# Patient Record
Sex: Female | Born: 2000 | Race: White | Hispanic: Yes | Marital: Single | Smoking: Never smoker
Health system: Southern US, Community
[De-identification: ages and names within clinical notes are randomized; demographics above are authoritative.]

## PROBLEM LIST (undated history)

## (undated) DIAGNOSIS — Z789 Other specified health status: Secondary | ICD-10-CM

## (undated) DIAGNOSIS — F419 Anxiety disorder, unspecified: Secondary | ICD-10-CM

## (undated) DIAGNOSIS — F319 Bipolar disorder, unspecified: Secondary | ICD-10-CM

## (undated) DIAGNOSIS — F32A Depression, unspecified: Secondary | ICD-10-CM

## (undated) DIAGNOSIS — F603 Borderline personality disorder: Secondary | ICD-10-CM

## (undated) HISTORY — PX: NO PAST SURGERIES: SHX2092

## (undated) HISTORY — PX: WISDOM TOOTH EXTRACTION: SHX21

---
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
## 2019-03-05 ENCOUNTER — Emergency Department (HOSPITAL_BASED_OUTPATIENT_CLINIC_OR_DEPARTMENT_OTHER): Payer: Medicaid Other

## 2019-03-05 ENCOUNTER — Encounter (HOSPITAL_BASED_OUTPATIENT_CLINIC_OR_DEPARTMENT_OTHER): Payer: Self-pay

## 2019-03-05 ENCOUNTER — Emergency Department (HOSPITAL_BASED_OUTPATIENT_CLINIC_OR_DEPARTMENT_OTHER)
Admission: EM | Admit: 2019-03-05 | Discharge: 2019-03-05 | Disposition: A | Discharge status: Home / Self Care | Payer: Medicaid Other | Attending: Emergency Medicine | Admitting: Emergency Medicine

## 2019-03-05 ENCOUNTER — Other Ambulatory Visit: Payer: Self-pay

## 2019-03-05 DIAGNOSIS — R1084 Generalized abdominal pain: Secondary | ICD-10-CM | POA: Diagnosis not present

## 2019-03-05 DIAGNOSIS — R112 Nausea with vomiting, unspecified: Secondary | ICD-10-CM

## 2019-03-05 DIAGNOSIS — R197 Diarrhea, unspecified: Secondary | ICD-10-CM | POA: Diagnosis not present

## 2019-03-05 DIAGNOSIS — Z91018 Allergy to other foods: Secondary | ICD-10-CM | POA: Diagnosis not present

## 2019-03-05 DIAGNOSIS — Z793 Long term (current) use of hormonal contraceptives: Secondary | ICD-10-CM | POA: Diagnosis not present

## 2019-03-05 LAB — CBC WITH DIFFERENTIAL/PLATELET
Abs Immature Granulocytes: 0.01 10*3/uL (ref 0.00–0.07)
Basophils Absolute: 0 10*3/uL (ref 0.0–0.1)
Basophils Relative: 1 %
Eosinophils Absolute: 0 10*3/uL (ref 0.0–0.5)
Eosinophils Relative: 0 %
HCT: 40.5 % (ref 36.0–46.0)
Hemoglobin: 13.3 g/dL (ref 12.0–15.0)
Immature Granulocytes: 0 %
Lymphocytes Relative: 32 %
Lymphs Abs: 1.8 10*3/uL (ref 0.7–4.0)
MCH: 31.7 pg (ref 26.0–34.0)
MCHC: 32.8 g/dL (ref 30.0–36.0)
MCV: 96.7 fL (ref 80.0–100.0)
Monocytes Absolute: 0.3 10*3/uL (ref 0.1–1.0)
Monocytes Relative: 6 %
Neutro Abs: 3.4 10*3/uL (ref 1.7–7.7)
Neutrophils Relative %: 61 %
Platelets: 245 10*3/uL (ref 150–400)
RBC: 4.19 MIL/uL (ref 3.87–5.11)
RDW: 12.4 % (ref 11.5–15.5)
WBC: 5.7 10*3/uL (ref 4.0–10.5)
nRBC: 0 % (ref 0.0–0.2)

## 2019-03-05 LAB — URINALYSIS, MICROSCOPIC (REFLEX)

## 2019-03-05 LAB — URINALYSIS, ROUTINE W REFLEX MICROSCOPIC
Bilirubin Urine: NEGATIVE
Glucose, UA: NEGATIVE mg/dL
Ketones, ur: 40 mg/dL — AB
Leukocytes,Ua: NEGATIVE
Nitrite: NEGATIVE
Protein, ur: NEGATIVE mg/dL
Specific Gravity, Urine: >1.03 — ABNORMAL HIGH (ref 1.005–1.030)
pH: 6 (ref 5.0–8.0)

## 2019-03-05 LAB — COMPREHENSIVE METABOLIC PANEL
ALT: 9 U/L (ref 0–44)
AST: 13 U/L — ABNORMAL LOW (ref 15–41)
Albumin: 4.3 g/dL (ref 3.5–5.0)
Alkaline Phosphatase: 51 U/L (ref 38–126)
Anion gap: 7 (ref 5–15)
BUN: 16 mg/dL (ref 6–20)
CO2: 23 mmol/L (ref 22–32)
Calcium: 9.1 mg/dL (ref 8.9–10.3)
Chloride: 104 mmol/L (ref 98–111)
Creatinine, Ser: 0.61 mg/dL (ref 0.44–1.00)
GFR calc Af Amer: >60 mL/min (ref >60)
GFR calc non Af Amer: >60 mL/min (ref >60)
Glucose, Bld: 88 mg/dL (ref 70–99)
Potassium: 3.8 mmol/L (ref 3.5–5.1)
Sodium: 134 mmol/L — ABNORMAL LOW (ref 135–145)
Total Bilirubin: 0.9 mg/dL (ref 0.3–1.2)
Total Protein: 7.5 g/dL (ref 6.5–8.1)

## 2019-03-05 LAB — LIPASE, BLOOD: Lipase: 23 U/L (ref 11–51)

## 2019-03-05 LAB — PREGNANCY, URINE: Preg Test, Ur: NEGATIVE

## 2019-03-05 MED ORDER — FAMOTIDINE IN NACL 20-0.9 MG/50ML-% IV SOLN
20.0000 mg | Freq: Once | INTRAVENOUS | Status: AC
  Administered 2019-03-05: 20 mg via INTRAVENOUS
  Filled 2019-03-05: qty 50

## 2019-03-05 MED ORDER — ONDANSETRON 4 MG PO TBDP: ORAL_TABLET | ORAL | 0 refills | Status: DC

## 2019-03-05 MED ORDER — OMEPRAZOLE 40 MG PO CPDR: 40.0000 mg | DELAYED_RELEASE_CAPSULE | Freq: Every day | ORAL | 0 refills | Status: DC

## 2019-03-05 MED ORDER — ALUM & MAG HYDROXIDE-SIMETH 200-200-20 MG/5ML PO SUSP
30.0000 mL | Freq: Once | ORAL | Status: AC
  Administered 2019-03-05: 30 mL via ORAL
  Filled 2019-03-05: qty 30

## 2019-03-05 MED ORDER — ACETAMINOPHEN 500 MG PO TABS
1000.0000 mg | ORAL_TABLET | Freq: Once | ORAL | Status: AC
  Administered 2019-03-05: 1000 mg via ORAL
  Filled 2019-03-05: qty 2

## 2019-03-05 MED ORDER — IOHEXOL 300 MG/ML  SOLN
100.0000 mL | Freq: Once | INTRAMUSCULAR | Status: AC | PRN
  Administered 2019-03-05: 100 mL via INTRAVENOUS

## 2019-03-05 MED ORDER — SUCRALFATE 1 GM/10ML PO SUSP: 1.0000 g | Freq: Three times a day (TID) | ORAL | 0 refills | Status: DC

## 2019-03-05 MED ORDER — SODIUM CHLORIDE 0.9 % IV BOLUS
1000.0000 mL | Freq: Once | INTRAVENOUS | Status: AC
  Administered 2019-03-05: 1000 mL via INTRAVENOUS

## 2019-03-05 MED ORDER — LIDOCAINE VISCOUS HCL 2 % MT SOLN
15.0000 mL | Freq: Once | OROMUCOSAL | Status: AC
  Administered 2019-03-05: 15 mL via ORAL
  Filled 2019-03-05: qty 15

## 2019-03-05 MED ORDER — MORPHINE SULFATE (PF) 4 MG/ML IV SOLN
4.0000 mg | Freq: Once | INTRAVENOUS | Status: AC
  Administered 2019-03-05: 4 mg via INTRAVENOUS
  Filled 2019-03-05: qty 1

## 2019-03-05 MED ORDER — ONDANSETRON HCL 4 MG/2ML IJ SOLN
4.0000 mg | Freq: Once | INTRAMUSCULAR | Status: AC
  Administered 2019-03-05: 4 mg via INTRAVENOUS
  Filled 2019-03-05: qty 2

## 2019-03-05 NOTE — ED Notes (Signed)
Patient transported to CT 

## 2019-03-05 NOTE — ED Notes (Signed)
Pt tolerating po fluids. No emesis.

## 2019-03-05 NOTE — Discharge Instructions (Signed)
Take omeprazole once daily in the morning at least 30 minutes before your first meal.  Use Carafate before meals and bedtime to help coat her stomach and relieve pain.  Use Zofran as needed for nausea.  You can also use over-the-counter Maalox as needed for breakthrough symptoms.  You can take Tylenol but please avoid ibuprofen, Aleve or aspirin as it can worsen your symptoms.  Please read the information provided on dietary changes because I think your symptoms are likely caused by irritation of the stomach lining and/or an ulcer.  Please call to schedule follow-up appointment with the GI doctor.  Return to the ED for any new or worsening symptoms.

## 2019-03-05 NOTE — ED Provider Notes (Signed)
[facility] EMERGENCY DEPARTMENT Provider Note   CSN: [id number] Arrival date & time: 03/05/19  1223     History   Chief Complaint Chief Complaint  Patient presents with   Abdominal Pain    HPI Sylvia Gray is a 18 y.o. female.     Sylvia HartStephanie M Sieg is a 18 y.o. female who is otherwise healthy, presents to the ED for evaluation of abdominal pain.  Patient reports she has been experiencing abdominal pain for the past 3 weeks.  She reports is primarily located across the upper abdomen, at first she thought it may have been related to something she ate, and because she frequently experiences diarrhea she thought she may have had IBS but pain has persisted over the past 3 weeks.  She reports it always always present.  She is unsure of anything that makes it better or worse.  She tried 1 dose of what she thought was an ulcer medication from her stepmom without improvement, but has not taken anything else to treat this pain.  She reports some intermittent associated vomiting, no hematemesis, she denies diarrhea, constipation or blood in her stools.  She denies dysuria or urinary frequency.  She went to urgent care today regarding symptoms and was noted to have a small amount of blood in her urine but she has not had any urinary symptoms and is not currently on her menstrual cycle.  She reports she intermittently has some bilateral flank pain when she is having worsening of her abdominal pain.  She denies any vaginal discharge or vaginal bleeding.  She is on birth control.  She denies history of similar pain in the past, no previous abdominal surgeries.  No other aggravating or alleviating factors.     History reviewed. No pertinent past medical history.  There are no active problems to display for this patient.   Past Surgical History:  Procedure Laterality Date   WISDOM TOOTH EXTRACTION       OB History    Gravida  0   Para  0   Term  0   Preterm  0   AB  0   Living  0     SAB  0   TAB  0   Ectopic  0   Multiple  0   Live Births  0            Home Medications    Prior to Admission medications   Medication Sig Start Date End Date Taking? Authorizing Provider  levonorgestrel-ethinyl estradiol (NORDETTE) 0.15-30 MG-MCG tablet Take 1 tablet by mouth daily.   Yes [provider]    Family History No family history on file.  Social History Social History   Tobacco Use   Smoking status: Never Smoker   Smokeless tobacco: Never Used  Substance Use Topics   Alcohol use: Yes    Comment: occ   Drug use: Never     Allergies   Apple, Kiwi extract, and Strawberry extract   Review of Systems Review of Systems  Constitutional: Negative for chills and fever.  HENT: Negative.   Respiratory: Negative for cough and shortness of breath.   Cardiovascular: Negative for chest pain.  Gastrointestinal: Positive for abdominal pain, nausea and vomiting. Negative for blood in stool, constipation and diarrhea.  Genitourinary: Negative for dysuria, frequency, pelvic pain, vaginal bleeding and vaginal discharge.  Musculoskeletal: Negative for arthralgias and myalgias.  Skin: Negative for color change and rash.  Neurological: Negative for dizziness, syncope  and light-headedness.     Physical Exam Updated Vital Signs BP 111/76 (BP Location: Left Arm)    Pulse 74    Temp 98.7 F (37.1 C) (Oral)    Resp 18    Ht 5\' 2"  (1.575 m)    Wt 57.2 kg    LMP 02/19/2019    SpO2 100%    BMI 23.05 kg/m   Physical Exam Vitals signs and nursing note reviewed.  Constitutional:      General: She is not in acute distress.    Appearance: She is well-developed. She is not diaphoretic.  HENT:     Head: Normocephalic and atraumatic.  Eyes:     General:        Right eye: No discharge.        Left eye: No discharge.     Pupils: Pupils are equal, round, and reactive to light.  Neck:     Musculoskeletal: Neck supple.    Cardiovascular:     Rate and Rhythm: Normal rate and regular rhythm.     Heart sounds: Normal heart sounds.  Pulmonary:     Effort: Pulmonary effort is normal. No respiratory distress.     Breath sounds: Normal breath sounds. No wheezing or rales.     Comments: Respirations equal and unlabored, patient able to speak in full sentences, lungs clear to auscultation bilaterally Abdominal:     General: Bowel sounds are normal. There is no distension.     Palpations: Abdomen is soft. There is no mass.     Tenderness: There is generalized abdominal tenderness. There is no guarding.     Comments: Abdomen is soft, nondistended, bowel sounds present throughout, there is generalized tenderness throughout the abdomen but it seems to be worse in the epigastric region as well as the right upper and lower quadrants with slight guarding, no rebound tenderness, patient endorses mild discomfort with palpation over the right flank but does not have true CVA tenderness bilaterally.  Musculoskeletal:        General: No deformity.  Skin:    General: Skin is warm and dry.     Capillary Refill: Capillary refill takes less than 2 seconds.  Neurological:     Mental Status: She is alert.     Coordination: Coordination normal.     Comments: Speech is clear, able to follow commands Moves extremities without ataxia, coordination intact  Psychiatric:        Mood and Affect: Mood normal.        Behavior: Behavior normal.      ED Treatments / Results  Labs (all labs ordered are listed, but only abnormal results are displayed) Labs Reviewed  COMPREHENSIVE METABOLIC PANEL - Abnormal; Notable for the following components:      Result Value   Sodium 134 (*)    AST 13 (*)    All other components within normal limits  URINALYSIS, ROUTINE W REFLEX MICROSCOPIC - Abnormal; Notable for the following components:   Specific Gravity, Urine >1.030 (*)    Hgb urine dipstick MODERATE (*)    Ketones, ur 40 (*)    All  other components within normal limits  URINALYSIS, MICROSCOPIC (REFLEX) - Abnormal; Notable for the following components:   Bacteria, UA MANY (*)    All other components within normal limits  URINE CULTURE  LIPASE, BLOOD  CBC WITH DIFFERENTIAL/PLATELET  PREGNANCY, URINE    EKG None  Radiology Ct Abdomen Pelvis W Contrast  Result Date: 03/05/2019 CLINICAL DATA:  Abdominal  pain and hematuria EXAM: CT ABDOMEN AND PELVIS WITH CONTRAST TECHNIQUE: Multidetector CT imaging of the abdomen and pelvis was performed using the standard protocol following bolus administration of intravenous contrast. CONTRAST:  OMNIPAQUE IOHEXOL 300 MG/ML  SOLN COMPARISON:  None. FINDINGS: Lower chest: Lung bases are clear. Hepatobiliary: No focal liver lesions are evident. There is mild fatty infiltration near the fissure for the ligamentum teres. Gallbladder wall is not appreciably thickened. There is no biliary duct dilatation. Pancreas: No pancreatic mass or inflammatory focus. Spleen: No splenic lesions are evident. Adrenals/Urinary Tract: Adrenals bilaterally appear normal. There is no renal mass or hydronephrosis on either side. There is contrast within each renal collecting system which precludes assessment for potential small calculi. No larger calculi evident to the extent that assessment can be made with intravenous contrast in the collecting systems. No ureteral calculi are evident on either side. Urinary bladder wall thickness is within normal limits. Stomach/Bowel: There is no appreciable bowel wall or mesenteric thickening. No evident bowel obstruction. Terminal ileum appears normal. No free air or portal venous air. Vascular/Lymphatic: No abdominal aortic aneurysm. No vascular lesions evident. No adenopathy evident in the abdomen or pelvis. Reproductive: Uterus is anteverted.  No pelvic mass evident. Other: Appendix appears normal. No abscess or ascites is apparent in the abdomen or pelvis. Musculoskeletal:  There are no blastic or lytic bone lesions. There is no intramuscular or abdominal wall lesion. IMPRESSION: 1. No bowel obstruction. No abscess in the abdomen or pelvis. Appendix appears normal. 2. No evident hydronephrosis. No ureteral calculus on either side. No intrarenal calculi are evident with the proviso that intravenous contrast within the collecting systems could mask small calculi. Urinary bladder wall thickness is within normal limits. Electronically Signed   By: Bretta Bang III M.D.   On: 03/05/2019 15:02    Procedures Procedures (including critical care time)  Medications Ordered in ED Medications  sodium chloride 0.9 % bolus 1,000 mL (0 mLs Intravenous Stopped 03/05/19 1615)  ondansetron (ZOFRAN) injection 4 mg (4 mg Intravenous Given 03/05/19 1458)  morphine 4 MG/ML injection 4 mg (4 mg Intravenous Given 03/05/19 1458)  famotidine (PEPCID) IVPB 20 mg premix (0 mg Intravenous Stopped 03/05/19 1615)  iohexol (OMNIPAQUE) 300 MG/ML solution 100 mL (100 mLs Intravenous Contrast Given 03/05/19 1441)  alum & mag hydroxide-simeth (MAALOX/MYLANTA) 200-200-20 MG/5ML suspension 30 mL (30 mLs Oral Given 03/05/19 1614)    And  lidocaine (XYLOCAINE) 2 % viscous mouth solution 15 mL (15 mLs Oral Given 03/05/19 1614)  acetaminophen (TYLENOL) tablet 1,000 mg (1,000 mg Oral Given 03/05/19 1708)     Initial Impression / Assessment and Plan / ED Course  I have reviewed the triage vital signs and the nursing notes.  Pertinent labs & imaging results that were available during my care of the patient were reviewed by me and considered in my medical decision making (see chart for details).  18 year old female who presents with 3 weeks of abdominal pain she localizes it to the upper area but on exam she has tenderness in the epigastric region as well as the right upper and lower quadrants.  She reports multiple episodes of associated vomiting.  Went to urgent care but was sent to the ED for  further evaluation.  She has not been seen previously or has been taking any medications for this pain.  No associated fevers.  Normal bowel movements.  Denies dysuria or urinary frequency.  Will get abdominal labs and CT abdomen pelvis given that she has  both right upper and lower quadrant tenderness as well as to excess the gallbladder, kidneys and appendix, differential also includes gastritis, PUD and GERD as patient reports pain seems to be a bit worse after eating and she reports some intermittent burning radiation up into the chest.  IV fluids, pain medication, Zofran and IV Pepcid ordered for symptomatic treatment.  Patient's lab work is overall reassuring, no leukocytosis and normal hemoglobin, no significant electrolyte derangements, normal renal liver function and normal lipase.  Negative pregnancy.  Urinalysis shows some blood in the urine, no leukocytes, many bacteria.  Patient denies urinary symptoms, will send for urine culture but discussed with patient do not feel this warrants treatment with antibiotics at this time.  After initial treatment she reports improvement in her symptoms.  Her CT scan is overall reassuring without evidence of appendicitis, cholecystitis, kidney stone or other acute abnormality.  Suspect this is likely gastritis or PUD.  Will give GI cocktail and p.o. challenge.  Patient tolerating p.o. fluids.  Will discharge with PPI, Carafate, Zofran.  Recommend GI follow-up.  Discussed return precautions.  Patient and mom expressed understanding and agreement with plan.  Discharged home in good condition.  Final Clinical Impressions(s) / ED Diagnoses   Final diagnoses:  Generalized abdominal pain  Non-intractable vomiting with nausea, unspecified vomiting type    ED Discharge Orders         Ordered    ondansetron (ZOFRAN ODT) 4 MG disintegrating tablet     03/05/19 1750    omeprazole (PRILOSEC) 40 MG capsule  Daily     03/05/19 1750    sucralfate (CARAFATE) 1 GM/10ML  suspension  3 times daily with meals & bedtime     03/05/19 1750           Dartha Lodge, PA-C 03/05/19 1754    Rolan Bucco, MD 03/06/19 1024

## 2019-03-05 NOTE — ED Triage Notes (Signed)
Pt c/o abd pain, n/v/fd x 3 weeks-sent from UC today with note that read RUQ pain and hematuria-no blood work done/states she was given toradol 60mg  inj and states pain is better-NAD-steady gait

## 2019-03-07 LAB — URINE CULTURE

## 2019-04-03 ENCOUNTER — Other Ambulatory Visit: Payer: Self-pay | Admitting: Gastroenterology

## 2019-04-03 DIAGNOSIS — R1084 Generalized abdominal pain: Secondary | ICD-10-CM

## 2019-04-12 ENCOUNTER — Emergency Department (HOSPITAL_BASED_OUTPATIENT_CLINIC_OR_DEPARTMENT_OTHER)
Admission: EM | Admit: 2019-04-12 | Discharge: 2019-04-12 | Disposition: A | Discharge status: Home / Self Care | Payer: Medicaid Other | Attending: Emergency Medicine | Admitting: Emergency Medicine

## 2019-04-12 ENCOUNTER — Other Ambulatory Visit: Payer: Self-pay

## 2019-04-12 DIAGNOSIS — R1084 Generalized abdominal pain: Secondary | ICD-10-CM | POA: Insufficient documentation

## 2019-04-12 DIAGNOSIS — R112 Nausea with vomiting, unspecified: Secondary | ICD-10-CM | POA: Diagnosis not present

## 2019-04-12 DIAGNOSIS — R42 Dizziness and giddiness: Secondary | ICD-10-CM | POA: Diagnosis not present

## 2019-04-12 LAB — CBC WITH DIFFERENTIAL/PLATELET
Abs Immature Granulocytes: 0.01 10*3/uL (ref 0.00–0.07)
Basophils Absolute: 0 10*3/uL (ref 0.0–0.1)
Basophils Relative: 1 %
Eosinophils Absolute: 0 10*3/uL (ref 0.0–0.5)
Eosinophils Relative: 1 %
HCT: 39 % (ref 36.0–46.0)
Hemoglobin: 13.2 g/dL (ref 12.0–15.0)
Immature Granulocytes: 0 %
Lymphocytes Relative: 40 %
Lymphs Abs: 2.3 10*3/uL (ref 0.7–4.0)
MCH: 32.2 pg (ref 26.0–34.0)
MCHC: 33.8 g/dL (ref 30.0–36.0)
MCV: 95.1 fL (ref 80.0–100.0)
Monocytes Absolute: 0.5 10*3/uL (ref 0.1–1.0)
Monocytes Relative: 9 %
Neutro Abs: 2.8 10*3/uL (ref 1.7–7.7)
Neutrophils Relative %: 49 %
Platelets: 252 10*3/uL (ref 150–400)
RBC: 4.1 MIL/uL (ref 3.87–5.11)
RDW: 12.4 % (ref 11.5–15.5)
WBC: 5.6 10*3/uL (ref 4.0–10.5)
nRBC: 0 % (ref 0.0–0.2)

## 2019-04-12 LAB — URINALYSIS, ROUTINE W REFLEX MICROSCOPIC
Glucose, UA: NEGATIVE mg/dL
Ketones, ur: 40 mg/dL — AB
Leukocytes,Ua: NEGATIVE
Nitrite: NEGATIVE
Protein, ur: NEGATIVE mg/dL
Specific Gravity, Urine: 1.025 (ref 1.005–1.030)
pH: 6.5 (ref 5.0–8.0)

## 2019-04-12 LAB — COMPREHENSIVE METABOLIC PANEL
ALT: 14 U/L (ref 0–44)
AST: 18 U/L (ref 15–41)
Albumin: 4.2 g/dL (ref 3.5–5.0)
Alkaline Phosphatase: 52 U/L (ref 38–126)
Anion gap: 10 (ref 5–15)
BUN: 13 mg/dL (ref 6–20)
CO2: 22 mmol/L (ref 22–32)
Calcium: 9.1 mg/dL (ref 8.9–10.3)
Chloride: 102 mmol/L (ref 98–111)
Creatinine, Ser: 0.7 mg/dL (ref 0.44–1.00)
GFR calc Af Amer: >60 mL/min (ref >60)
GFR calc non Af Amer: >60 mL/min (ref >60)
Glucose, Bld: 118 mg/dL — ABNORMAL HIGH (ref 70–99)
Potassium: 3.4 mmol/L — ABNORMAL LOW (ref 3.5–5.1)
Sodium: 134 mmol/L — ABNORMAL LOW (ref 135–145)
Total Bilirubin: 0.8 mg/dL (ref 0.3–1.2)
Total Protein: 7.3 g/dL (ref 6.5–8.1)

## 2019-04-12 LAB — LIPASE, BLOOD: Lipase: 26 U/L (ref 11–51)

## 2019-04-12 LAB — URINALYSIS, MICROSCOPIC (REFLEX)

## 2019-04-12 LAB — PREGNANCY, URINE: Preg Test, Ur: NEGATIVE

## 2019-04-12 MED ORDER — ONDANSETRON 4 MG PO TBDP: 4.0000 mg | ORAL_TABLET | Freq: Four times a day (QID) | ORAL | 1 refills | Status: DC | PRN

## 2019-04-12 MED ORDER — DICYCLOMINE HCL 20 MG PO TABS: 20.0000 mg | ORAL_TABLET | Freq: Three times a day (TID) | ORAL | 0 refills | Status: DC | PRN

## 2019-04-12 MED ORDER — SODIUM CHLORIDE 0.9 % IV BOLUS (SEPSIS)
1000.0000 mL | Freq: Once | INTRAVENOUS | Status: AC
  Administered 2019-04-12: 1000 mL via INTRAVENOUS

## 2019-04-12 MED ORDER — DICYCLOMINE HCL 10 MG PO CAPS
20.0000 mg | ORAL_CAPSULE | Freq: Once | ORAL | Status: AC
  Administered 2019-04-12: 20 mg via ORAL
  Filled 2019-04-12: qty 2

## 2019-04-12 MED ORDER — PANTOPRAZOLE SODIUM 40 MG IV SOLR
40.0000 mg | Freq: Once | INTRAVENOUS | Status: AC
  Administered 2019-04-12: 40 mg via INTRAVENOUS
  Filled 2019-04-12: qty 40

## 2019-04-12 MED ORDER — PANTOPRAZOLE SODIUM 40 MG PO TBEC: 40.0000 mg | DELAYED_RELEASE_TABLET | Freq: Every day | ORAL | 1 refills | Status: DC

## 2019-04-12 MED ORDER — ONDANSETRON HCL 4 MG/2ML IJ SOLN
4.0000 mg | Freq: Once | INTRAMUSCULAR | Status: AC
  Administered 2019-04-12: 4 mg via INTRAVENOUS
  Filled 2019-04-12: qty 2

## 2019-04-12 NOTE — ED Triage Notes (Addendum)
Pt reports generalized abd pain ongoing since last visit to our ED (11/19) and reports pain has not gotten any better. Supposed to see GI in the new year. Tearful in triage. Reports dizzy with position changes. Reports nausea and vomiting. States omeprazole and Carafate that she was given during last visit are not helping much.

## 2019-04-12 NOTE — ED Notes (Signed)
Ice water provided for po challenge.

## 2019-04-12 NOTE — Discharge Instructions (Addendum)
Please keep your appointment with your gastroenterologist as scheduled on January 11.  You may take over-the-counter Tylenol 1000 mg every 6 hours as needed for pain.

## 2019-04-12 NOTE — ED Provider Notes (Signed)
TIME SEEN: 1:12 AM  CHIEF COMPLAINT: Abdominal pain, nausea and vomiting  HPI: Patient is a 18 year old female with no significant past medical history who presents to the emergency department with emesis a month and a half of diffuse abdominal pain, nausea and vomiting.  Symptoms are worse with smelling food and after eating.  Was seen in the emergency department on 03/05/2019 and had no acute abnormality visualized on CT imaging.  Labs unremarkable.  Was discharged home with omeprazole, Carafate and Zofran.  States she ran out of Zofran and states she has really needed it.  She has had worsening nausea and vomiting this past week.  Had diarrhea initially but this has resolved.  No bloody stools or melena.  No dysuria, hematuria, vaginal bleeding or discharge.  States she feels a lot of pressure throughout her abdomen.  States the above medications did not help her pain but did help her nausea and vomiting.  She has an appointment to see gastroenterology on January 11.  States she is feeling weak and lightheaded.  ROS: See HPI Constitutional: no fever  Eyes: no drainage  ENT: no runny nose   Cardiovascular:  no chest pain  Resp: no SOB  GI:  vomiting GU: no dysuria Integumentary: no rash  Allergy: no hives  Musculoskeletal: no leg swelling  Neurological: no slurred speech ROS otherwise negative  PAST MEDICAL HISTORY/PAST SURGICAL HISTORY:  No past medical history on file.  MEDICATIONS:  Prior to Admission medications   Medication Sig Start Date End Date Taking? Authorizing Provider  levonorgestrel-ethinyl estradiol (NORDETTE) 0.15-30 MG-MCG tablet Take 1 tablet by mouth daily.    [provider]  omeprazole (PRILOSEC) 40 MG capsule Take 1 capsule (40 mg total) by mouth daily. 03/05/19   Jacqlyn Larsen, PA-C  ondansetron (ZOFRAN ODT) 4 MG disintegrating tablet 4mg  ODT q4 hours prn nausea/vomit 03/05/19   Jacqlyn Larsen, PA-C  sucralfate (CARAFATE) 1 GM/10ML suspension Take 10  mLs (1 g total) by mouth 4 (four) times daily -  with meals and at bedtime. 03/05/19   Jacqlyn Larsen, PA-C    ALLERGIES:  Allergies  Allergen Reactions  . Apple Swelling  . Kiwi Extract Swelling  . Strawberry Extract Swelling    SOCIAL HISTORY:  Social History   Tobacco Use  . Smoking status: Never Smoker  . Smokeless tobacco: Never Used  Substance Use Topics  . Alcohol use: Yes    Comment: occ    FAMILY HISTORY: No family history on file.  EXAM: BP 123/78 (BP Location: Right Arm)   Pulse 91   Temp 99 F (37.2 C) (Oral)   Resp 20   Ht 5\' 2"  (1.575 m)   Wt 57.2 kg   LMP 03/15/2019 (Approximate)   SpO2 100%   BMI 23.05 kg/m  CONSTITUTIONAL: Alert and oriented and responds appropriately to questions. Well-appearing; well-nourished, intermittently tearful HEAD: Normocephalic EYES: Conjunctivae clear, pupils appear equal, EOM appear intact ENT: normal nose; moist mucous membranes NECK: Supple, normal ROM CARD: RRR; S1 and S2 appreciated; no murmurs, no clicks, no rubs, no gallops RESP: Normal chest excursion without splinting or tachypnea; breath sounds clear and equal bilaterally; no wheezes, no rhonchi, no rales, no hypoxia or respiratory distress, speaking full sentences ABD/GI: Normal bowel sounds; non-distended; soft, mildly tender throughout the abdomen worse in the upper abdomen, no rebound, no guarding, no peritoneal signs, no hepatosplenomegaly BACK:  The back appears normal EXT: Normal ROM in all joints; no deformity noted, no edema; no  cyanosis SKIN: Normal color for age and race; warm; no rash on exposed skin NEURO: Moves all extremities equally PSYCH: The patient's mood and manner are appropriate.   MEDICAL DECISION MAKING: Patient here with diffuse abdominal pain.  Has an appointment to see GI on January 11.  Reports continued nausea and vomiting.  CT scan performed 03/05/2019 for the same symptoms showed no acute abnormality.  I do not feel she needs  repeat CT imaging at this time.  Will repeat labs, urine.  Will treat symptoms with IV fluids, Zofran, Protonix, Bentyl.  Agree with previous provider that symptoms likely related to gastritis, peptic ulcer disease.  She would likely benefit from an endoscopy as an outpatient.  Doubt appendicitis, bowel obstruction, perforation, cholecystitis, pancreatitis, diverticulitis based on her benign exam.  ED PROGRESS: Patient reports feeling better.  Able to tolerate p.o.  Labs, urine here are unremarkable.  Does have small amount of ketones in her urine but has received IV fluids.  Will discharge with prescriptions of Protonix, Zofran, Bentyl.  She has an appoint with gastroenterology on the 11th.  Have encouraged her to keep this appointment.  Recommended bland diet at this time.  Discussed return precautions.   At this time, I do not feel there is any life-threatening condition present. I have reviewed, interpreted and discussed all results (EKG, imaging, lab, urine as appropriate) and exam findings with patient/family. I have reviewed nursing notes and appropriate previous records.  I feel the patient is safe to be discharged home without further emergent workup and can continue workup as an outpatient as needed. Discussed usual and customary return precautions. Patient/family verbalize understanding and are comfortable with this plan.  Outpatient follow-up has been provided as needed. All questions have been answered.      KAIZLEE CARLINO was evaluated in Emergency Department on 04/12/2019 for the symptoms described in the history of present illness. She was evaluated in the context of the global COVID-19 pandemic, which necessitated consideration that the patient might be at risk for infection with the SARS-CoV-2 virus that causes COVID-19. Institutional protocols and algorithms that pertain to the evaluation of patients at risk for COVID-19 are in a state of rapid change based on information  released by regulatory bodies including the CDC and federal and state organizations. These policies and algorithms were followed during the patient's care in the ED.  Patient was seen wearing N95, face shield, gloves.    Kaysa Roulhac, Layla Maw, DO 04/12/19 [phone]

## 2019-04-27 ENCOUNTER — Ambulatory Visit
Admission: RE | Admit: 2019-04-27 | Discharge: 2019-04-27 | Disposition: A | Discharge status: Home / Self Care | Payer: Medicaid Other | Source: Ambulatory Visit | Attending: Gastroenterology | Admitting: Gastroenterology

## 2019-04-27 DIAGNOSIS — R1084 Generalized abdominal pain: Secondary | ICD-10-CM

## 2019-05-01 ENCOUNTER — Other Ambulatory Visit: Payer: Medicaid Other

## 2019-05-05 ENCOUNTER — Other Ambulatory Visit: Payer: Medicaid Other

## 2019-05-08 ENCOUNTER — Ambulatory Visit
Admission: RE | Admit: 2019-05-08 | Discharge: 2019-05-08 | Disposition: A | Discharge status: Home / Self Care | Payer: Medicaid Other | Source: Ambulatory Visit | Attending: Gastroenterology | Admitting: Gastroenterology

## 2019-05-08 DIAGNOSIS — R1084 Generalized abdominal pain: Secondary | ICD-10-CM

## 2019-06-23 ENCOUNTER — Other Ambulatory Visit: Payer: Self-pay

## 2019-06-23 ENCOUNTER — Ambulatory Visit
Admission: EM | Admit: 2019-06-23 | Discharge: 2019-06-23 | Disposition: A | Discharge status: Home / Self Care | Payer: Medicaid Other | Attending: Emergency Medicine | Admitting: Emergency Medicine

## 2019-06-23 DIAGNOSIS — R1084 Generalized abdominal pain: Secondary | ICD-10-CM | POA: Diagnosis not present

## 2019-06-23 MED ORDER — ONDANSETRON HCL 4 MG PO TABS: 4.0000 mg | ORAL_TABLET | Freq: Four times a day (QID) | ORAL | 0 refills | Status: DC

## 2019-06-23 NOTE — ED Provider Notes (Addendum)
[facility]    CSN: [id number] Arrival date & time: 06/23/19  1534      History   Chief Complaint Chief Complaint  Patient presents with  . Abdominal Pain    HPI Sylvia Gray is a 19 y.o. female with chronic generalized abdominal pain presenting for the same.  Feels that it has been worse this week since Saturday: No change in diet, lifestyle, medications.  Patient has had multiple ER evaluations in outpatient GI work-up all negative for acute processes.  Patient endorsing emesis yesterday without bile or blood: No nausea or vomiting today.  Patient denies history of abdominal surgery.  Last bowel movement was this morning without blood, melena, mucus, steatorrhea.  Patient denies difficulty swallowing, choking, drooling, dental pain.  No fever, chills, arthralgias, myalgias.  Patient denies history of GERD.  States previous medications prescribed by ER and GI including Prilosec and Carafate have not been helpful.  States Zofran does help with some nausea, though has not taken this in a while.  LMP 06/16/2019: Not currently sexually active.  Denies pelvic pain, vaginal discharge. Patient notes possible syncopal event last Wednesday while at work.  Feels this is due to prolonged standing "I never get to take a break ".  States Sylvia Gray was at able to drink much water that day either.  Denied history of seizures.  Denies fall, head trauma, LOC, amnesia or irritability prior to or since event.  No recurrence of event.  States her boss was "standing right there and caught me".  Denies history of cardiopulmonary disease.  Denies preceding chest pain, palpitations, difficulty breathing or lightheadedness.  Has not had these symptoms since, including in office today.    History reviewed. No pertinent past medical history.  There are no problems to display for this patient.   Past Surgical History:  Procedure Laterality Date  . WISDOM TOOTH EXTRACTION      OB History    Gravida  0   Para  0   Term  0   Preterm  0   AB  0   Living  0     SAB  0   TAB  0   Ectopic  0   Multiple  0   Live Births  0            Home Medications    Prior to Admission medications   Medication Sig Start Date End Date Taking? Authorizing Provider  levonorgestrel-ethinyl estradiol (NORDETTE) 0.15-30 MG-MCG tablet Take 1 tablet by mouth daily.    [provider]  ondansetron (ZOFRAN) 4 MG tablet Take 1 tablet (4 mg total) by mouth every 6 (six) hours. 06/23/19   Hall-Potvin, [place], PA-C  omeprazole (PRILOSEC) 40 MG capsule Take 1 capsule (40 mg total) by mouth daily. 03/05/19 04/12/19  Jacqlyn Larsen, PA-C  sucralfate (CARAFATE) 1 GM/10ML suspension Take 10 mLs (1 g total) by mouth 4 (four) times daily -  with meals and at bedtime. 03/05/19 04/12/19  Jacqlyn Larsen, PA-C    Family History History reviewed. No pertinent family history.  Social History Social History   Tobacco Use  . Smoking status: Never Smoker  . Smokeless tobacco: Never Used  Substance Use Topics  . Alcohol use: Yes    Comment: occ  . Drug use: Never     Allergies   Apple, Kiwi extract, and Strawberry extract   Review of Systems As per HPI   Physical Exam Triage Vital Signs ED Triage  Vitals  Enc Vitals Group     BP      Pulse      Resp      Temp      Temp src      SpO2      Weight      Height      Head Circumference      Peak Flow      Pain Score      Pain Loc      Pain Edu?      Excl. in GC?    No data found.  Updated Vital Signs BP 121/80 (BP Location: Left Arm)   Pulse 73   Temp 98.8 F (37.1 C) (Oral)   Resp 16   LMP 06/16/2019   SpO2 98%   Visual Acuity Right Eye Distance:   Left Eye Distance:   Bilateral Distance:    Right Eye Near:   Left Eye Near:    Bilateral Near:     Physical Exam Constitutional:      General: Sylvia Gray is not in acute distress.    Appearance: Sylvia Gray is well-developed and normal weight. Sylvia Gray is not  ill-appearing or diaphoretic.  HENT:     Head: Normocephalic and atraumatic.     Mouth/Throat:     Mouth: Mucous membranes are moist.     Pharynx: Oropharynx is clear.  Eyes:     General: No scleral icterus.    Pupils: Pupils are equal, round, and reactive to light.  Cardiovascular:     Rate and Rhythm: Normal rate and regular rhythm.     Heart sounds: No murmur. No gallop.   Pulmonary:     Effort: Pulmonary effort is normal. No respiratory distress.     Breath sounds: No wheezing.  Abdominal:     General: Abdomen is flat. Bowel sounds are normal. There is no distension or abdominal bruit.     Palpations: Abdomen is soft. There is no hepatomegaly or splenomegaly.     Tenderness: There is no abdominal tenderness. There is no right CVA tenderness, left CVA tenderness or guarding. Negative signs include Murphy's sign, Rovsing's sign and McBurney's sign.     Hernia: No hernia is present.  Skin:    General: Skin is warm.     Capillary Refill: Capillary refill takes less than 2 seconds.     Coloration: Skin is not cyanotic, jaundiced, mottled or pale.     Findings: No erythema or rash.  Neurological:     Mental Status: Sylvia Gray is alert and oriented to person, place, and time.      UC Treatments / Results  Labs (all labs ordered are listed, but only abnormal results are displayed) Labs Reviewed - No data to display  EKG   Radiology No results found.  Procedures Procedures (including critical care time)  Medications Ordered in UC Medications - No data to display  Initial Impression / Assessment and Plan / UC Course  I have reviewed the triage vital signs and the nursing notes.  Pertinent labs & imaging results that were available during my care of the patient were reviewed by me and considered in my medical decision making (see chart for details).     Patient afebrile, nontoxic in office today.  Exam is benign.  Previous work-up has been benign as well: Those ER records  reviewed by me at time of visit.  Patient endorsing syncopal event, though this was nearly 1 week ago and without sequela.  No prodrome  or cardiopulmonary symptoms.  Patient denies orthostasis.  Will provide Zofran for nausea and encouraged oral hydration.  This provider coordinated care: Now has PCP appointment on 4/15-intends to keep.  Patient to keep symptom and food logs in the interim.  Recommended patient follow-up with GI provider as well.  Patient requesting work note to be able to have water intake breaks through the day: Provided.  Return precautions discussed, patient verbalized understanding and is agreeable to plan. Final Clinical Impressions(s) / UC Diagnoses   Final diagnoses:  Generalized abdominal pain     Discharge Instructions     Important to keep PCP appointment on 4/15. Keep symptom log in the interim. Also recommend keeping food log. Take Zofran as needed for nausea: Important to been drinking plenty of water to hydrate. Return for worsening abdominal pain, nausea, vomiting, fever, chest pain, difficulty breathing.    ED Prescriptions    Medication Sig Dispense Auth. Provider   ondansetron (ZOFRAN) 4 MG tablet Take 1 tablet (4 mg total) by mouth every 6 (six) hours. 12 tablet Hall-Potvin, [place], PA-C     PDMP not reviewed this encounter.   Hall-Potvin, [place], PA-C 06/23/19 1907    Odette Fraction [place], [place] 06/23/19 2641

## 2019-06-23 NOTE — Discharge Instructions (Signed)
Important to keep PCP appointment on 4/15. Keep symptom log in the interim. Also recommend keeping food log. Take Zofran as needed for nausea: Important to been drinking plenty of water to hydrate. Return for worsening abdominal pain, nausea, vomiting, fever, chest pain, difficulty breathing.

## 2019-06-23 NOTE — ED Triage Notes (Signed)
Pt c/o generalized abdominal pain since November off and on. States also since November having weakness, nausea, decreased appetite and can't sleep at night. States has had GI a neg. work up. Pt states passed out at work last Wednesday for .

## 2019-06-24 ENCOUNTER — Telehealth: Payer: Self-pay | Admitting: Emergency Medicine

## 2019-06-24 NOTE — Telephone Encounter (Signed)
Left voicemail checking in on patient, and encouraged return call with any continuing questions or concerns.    

## 2019-07-30 ENCOUNTER — Encounter: Payer: Self-pay | Admitting: Internal Medicine

## 2019-07-30 ENCOUNTER — Telehealth (INDEPENDENT_AMBULATORY_CARE_PROVIDER_SITE_OTHER): Payer: Medicaid Other | Admitting: Internal Medicine

## 2019-07-30 DIAGNOSIS — Z131 Encounter for screening for diabetes mellitus: Secondary | ICD-10-CM

## 2019-07-30 DIAGNOSIS — R5383 Other fatigue: Secondary | ICD-10-CM | POA: Diagnosis not present

## 2019-07-30 DIAGNOSIS — R109 Unspecified abdominal pain: Secondary | ICD-10-CM

## 2019-07-30 DIAGNOSIS — Z7689 Persons encountering health services in other specified circumstances: Secondary | ICD-10-CM

## 2019-07-30 DIAGNOSIS — G8929 Other chronic pain: Secondary | ICD-10-CM

## 2019-07-30 NOTE — Progress Notes (Signed)
Virtual Visit via Telephone Note  I connected with Sylvia Gray, on 07/30/2019 at 2:04 PM by telephone due to the COVID-19 pandemic and verified that I am speaking with the correct person using two identifiers.   Consent: I discussed the limitations, risks, security and privacy concerns of performing an evaluation and management service by telephone and the availability of in person appointments. I also discussed with the patient that there may be a patient responsible charge related to this service. The patient expressed understanding and agreed to proceed.   Location of Patient: Home   Location of Provider: Clinic    Persons participating in Telemedicine visit: Jaeliana Lococo [facility] Dr. Earlene Plater      History of Present Illness: Patient has a visit to establish care and to f/u on chronic generalized abdominal pain. Patient has had multiple ER/UC visits for the same complaint with normal work up. Has also seen GI specialists for outpatient work up. Seems that she has had a negative work up and no etiology of her abdominal pain has been identified. Patient reports that her abdominal pain has mostly resolved. However, she feels tired/lightheaded and feels like her appetite has diminished.    No past medical history on file. Allergies  Allergen Reactions  . Apple Swelling  . Kiwi Extract Swelling  . Strawberry Extract Swelling    Current Outpatient Medications on File Prior to Visit  Medication Sig Dispense Refill  . levonorgestrel-ethinyl estradiol (NORDETTE) 0.15-30 MG-MCG tablet Take 1 tablet by mouth daily.    . ondansetron (ZOFRAN) 4 MG tablet Take 1 tablet (4 mg total) by mouth every 6 (six) hours. 12 tablet 0  . [DISCONTINUED] omeprazole (PRILOSEC) 40 MG capsule Take 1 capsule (40 mg total) by mouth daily. 30 capsule 0  . [DISCONTINUED] sucralfate (CARAFATE) 1 GM/10ML suspension Take 10 mLs (1 g total) by mouth 4 (four) times daily -   with meals and at bedtime. 420 mL 0   No current facility-administered medications on file prior to visit.    Observations/Objective: NAD. Speaking clearly.  Work of breathing normal.  Alert and oriented. Mood appropriate.   Assessment and Plan: 1. Encounter to establish care  2. Chronic abdominal pain Has had recent normal CT abdomen, barium enema, and upper GI series. Followed by GI as patient.   3. Fatigue, unspecified type Patient now complaining of fatigue and other vague symptoms. Will evaluate for anemia, electrolyte abnormalities, metabolic abnormalities, thyroid disorder, and vitamin deficiencies.  - CBC; Future - Comprehensive metabolic panel; Future - TSH; Future - VITAMIN D 25 Hydroxy (Vit-D Deficiency, Fractures); Future - Vitamin B12; Future  4. Screening for diabetes mellitus (DM) - Hemoglobin A1c; Future   Follow Up Instructions: Lab visit 4/16    I discussed the assessment and treatment plan with the patient. The patient was provided an opportunity to ask questions and all were answered. The patient agreed with the plan and demonstrated an understanding of the instructions.   The patient was advised to call back or seek an in-person evaluation if the symptoms worsen or if the condition fails to improve as anticipated.     I provided 12 minutes total of non-face-to-face time during this encounter including median intraservice time, reviewing previous notes, investigations, ordering medications, medical decision making, coordinating care and patient verbalized understanding at the end of the visit.    Marcy Siren, D.O. Primary [facility] at [facility]  07/30/2019, 2:04 PM

## 2019-07-31 ENCOUNTER — Other Ambulatory Visit: Payer: Medicaid Other

## 2020-03-16 ENCOUNTER — Emergency Department (HOSPITAL_BASED_OUTPATIENT_CLINIC_OR_DEPARTMENT_OTHER)
Admission: EM | Admit: 2020-03-16 | Discharge: 2020-03-16 | Disposition: A | Discharge status: Home / Self Care | Payer: Medicaid Other | Attending: Emergency Medicine | Admitting: Emergency Medicine

## 2020-03-16 ENCOUNTER — Encounter (HOSPITAL_BASED_OUTPATIENT_CLINIC_OR_DEPARTMENT_OTHER): Payer: Self-pay | Admitting: *Deleted

## 2020-03-16 ENCOUNTER — Other Ambulatory Visit: Payer: Self-pay

## 2020-03-16 DIAGNOSIS — R070 Pain in throat: Secondary | ICD-10-CM | POA: Diagnosis present

## 2020-03-16 DIAGNOSIS — J039 Acute tonsillitis, unspecified: Secondary | ICD-10-CM | POA: Diagnosis not present

## 2020-03-16 LAB — GROUP A STREP BY PCR: Group A Strep by PCR: NOT DETECTED

## 2020-03-16 MED ORDER — CLINDAMYCIN HCL 300 MG PO CAPS: 300.0000 mg | ORAL_CAPSULE | Freq: Three times a day (TID) | ORAL | 0 refills | Status: DC

## 2020-03-16 NOTE — ED Provider Notes (Signed)
[facility] EMERGENCY DEPARTMENT Provider Note   CSN: [id number] Arrival date & time: 03/16/20  1810     History Chief Complaint  Patient presents with  . Sore Throat    Sylvia Gray Sylvia Gray is a 19 y.o. female who presented with sore throat.  Patient has been having sore throat for the last week or so.  She states that she noticed that her neck is slightly swollen.  She states that she is still able to swallow but it is very painful when she swallows.  Denies any fevers or chills.  Denies any Covid exposure.  Denies any recent history of strep throat.  The history is provided by the patient.       History reviewed. No pertinent past medical history.  There are no problems to display for this patient.   Past Surgical History:  Procedure Laterality Date  . WISDOM TOOTH EXTRACTION       OB History    Gravida  0   Para  0   Term  0   Preterm  0   AB  0   Living  0     SAB  0   TAB  0   Ectopic  0   Multiple  0   Live Births  0           No family history on file.  Social History   Tobacco Use  . Smoking status: Never Smoker  . Smokeless tobacco: Never Used  Vaping Use  . Vaping Use: Every day  Substance Use Topics  . Alcohol use: Yes    Comment: occ  . Drug use: Never    Home Medications Prior to Admission medications   Medication Sig Start Date End Date Taking? Authorizing Provider  levonorgestrel-ethinyl estradiol (NORDETTE) 0.15-30 MG-MCG tablet Take 1 tablet by mouth daily.    [provider]  ondansetron (ZOFRAN) 4 MG tablet Take 1 tablet (4 mg total) by mouth every 6 (six) hours. 06/23/19   Hall-Potvin, [place], PA-C  omeprazole (PRILOSEC) 40 MG capsule Take 1 capsule (40 mg total) by mouth daily. 03/05/19 04/12/19  Dartha Lodge, PA-C  sucralfate (CARAFATE) 1 GM/10ML suspension Take 10 mLs (1 g total) by mouth 4 (four) times daily -  with meals and at bedtime. 03/05/19 04/12/19  Dartha Lodge, PA-C     Allergies    Apple, Kiwi extract, and Strawberry extract  Review of Systems   Review of Systems  HENT: Positive for sore throat.   All other systems reviewed and are negative.   Physical Exam Updated Vital Signs BP 99/62 (BP Location: Left Arm)   Pulse 82   Temp 98.2 F (36.8 C) (Oral)   Resp 16   Ht 5\' 3"  (1.6 m)   Wt 56.7 kg   LMP 02/17/2020   SpO2 100%   BMI 22.14 kg/m   Physical Exam Vitals and nursing note reviewed.  Constitutional:      Appearance: She is well-developed.  HENT:     Head: Normocephalic.     Right Ear: Tympanic membrane normal.     Left Ear: Tympanic membrane normal.     Mouth/Throat:     Tonsils: Tonsillar exudate and tonsillar abscess present.     Comments: Bilateral tonsils are enlarged with exudates.  The right tonsil is slightly enlarged compared to the left but there is no obvious uvular deviation. Neck:     Comments: Cervical lymphadenopathy Cardiovascular:     Rate  and Rhythm: Normal rate and regular rhythm.  Pulmonary:     Effort: Pulmonary effort is normal.     Breath sounds: Normal breath sounds.  Abdominal:     Palpations: Abdomen is soft.  Skin:    General: Skin is warm.     Capillary Refill: Capillary refill takes less than 2 seconds.  Neurological:     General: No focal deficit present.     Mental Status: She is alert and oriented to person, place, and time.  Psychiatric:        Mood and Affect: Mood normal.     ED Results / Procedures / Treatments   Labs (all labs ordered are listed, but only abnormal results are displayed) Labs Reviewed  GROUP A STREP BY PCR    EKG None  Radiology No results found.  Procedures Procedures (including critical care time)  Medications Ordered in ED Medications - No data to display  ED Course  I have reviewed the triage vital signs and the nursing notes.  Pertinent labs & imaging results that were available during my care of the patient were reviewed by me and  considered in my medical decision making (see chart for details).    MDM Rules/Calculators/A&P                         Sylvia Gray is a 19 y.o. female here presenting with sore throat.  Patient has tonsillitis on exam.  Patient is afebrile.  Her strep test is negative but will give a course of antibiotics for tonsillitis.   Final Clinical Impression(s) / ED Diagnoses Final diagnoses:  None    Rx / DC Orders ED Discharge Orders    None       Charlynne Pander, MD 03/16/20 2031

## 2020-03-16 NOTE — Discharge Instructions (Signed)
Take clindamycin three times daily for a week for tonsillitis   See your doctor   Return to ER if you have worse trouble swallowing, fever, dehydration

## 2020-03-16 NOTE — ED Triage Notes (Signed)
C/o sore throat x 5 days

## 2020-04-12 ENCOUNTER — Emergency Department (HOSPITAL_BASED_OUTPATIENT_CLINIC_OR_DEPARTMENT_OTHER)
Admission: EM | Admit: 2020-04-12 | Discharge: 2020-04-12 | Disposition: A | Discharge status: Home / Self Care | Payer: Medicaid Other | Attending: Emergency Medicine | Admitting: Emergency Medicine

## 2020-04-12 ENCOUNTER — Encounter (HOSPITAL_BASED_OUTPATIENT_CLINIC_OR_DEPARTMENT_OTHER): Payer: Self-pay | Admitting: *Deleted

## 2020-04-12 ENCOUNTER — Other Ambulatory Visit: Payer: Self-pay

## 2020-04-12 DIAGNOSIS — Z5321 Procedure and treatment not carried out due to patient leaving prior to being seen by health care provider: Secondary | ICD-10-CM | POA: Diagnosis not present

## 2020-04-12 DIAGNOSIS — J029 Acute pharyngitis, unspecified: Secondary | ICD-10-CM | POA: Insufficient documentation

## 2020-04-12 DIAGNOSIS — M791 Myalgia, unspecified site: Secondary | ICD-10-CM | POA: Insufficient documentation

## 2020-04-12 NOTE — ED Triage Notes (Addendum)
Body aches and sore throat. Symptoms x 2 weeks. She took a round of antibiotics with no improvement. Her strep was negative.

## 2020-07-08 ENCOUNTER — Ambulatory Visit
Admission: EM | Admit: 2020-07-08 | Discharge: 2020-07-08 | Disposition: A | Discharge status: Home / Self Care | Payer: Medicaid Other | Attending: Emergency Medicine | Admitting: Emergency Medicine

## 2020-07-08 ENCOUNTER — Ambulatory Visit: Payer: Self-pay

## 2020-07-08 ENCOUNTER — Other Ambulatory Visit: Payer: Self-pay

## 2020-07-08 DIAGNOSIS — R1084 Generalized abdominal pain: Secondary | ICD-10-CM | POA: Diagnosis not present

## 2020-07-08 DIAGNOSIS — R079 Chest pain, unspecified: Secondary | ICD-10-CM | POA: Insufficient documentation

## 2020-07-08 DIAGNOSIS — N898 Other specified noninflammatory disorders of vagina: Secondary | ICD-10-CM

## 2020-07-08 DIAGNOSIS — R300 Dysuria: Secondary | ICD-10-CM | POA: Diagnosis not present

## 2020-07-08 LAB — POCT URINALYSIS DIP (MANUAL ENTRY)
Bilirubin, UA: NEGATIVE
Glucose, UA: NEGATIVE mg/dL
Ketones, POC UA: NEGATIVE mg/dL
Nitrite, UA: NEGATIVE
Protein Ur, POC: NEGATIVE mg/dL
Spec Grav, UA: 1.025 (ref 1.010–1.025)
Urobilinogen, UA: 0.2 E.U./dL
pH, UA: 7.5 (ref 5.0–8.0)

## 2020-07-08 LAB — POCT URINE PREGNANCY: Preg Test, Ur: NEGATIVE

## 2020-07-08 NOTE — ED Provider Notes (Signed)
[facility]    CSN: [id number] Arrival date & time: 07/08/20  1203      History   Chief Complaint Chief Complaint  Patient presents with  . Chest Pain  . Vaginal Itching    HPI Sylvia Gray Verbie Babic is a 20 y.o. female.   Patient presents with 5-day history of chest pain radiating to her back.  She describes this as pressure.  No aggravating or alleviating factors.  She also reports abdominal pain, dysuria, and vaginal irritation x4 days.  She states the abdominal pain is similar to her chronic abdominal pain.  She denies fever, chills, cough, shortness of breath, vomiting, diarrhea, hematuria, vaginal discharge, pelvic pain, or other symptoms.  No treatments attempted at home.  Her medical history includes chronic abdominal pain.  The history is provided by the patient and medical records.    History reviewed. No pertinent past medical history.  There are no problems to display for this patient.   Past Surgical History:  Procedure Laterality Date  . WISDOM TOOTH EXTRACTION      OB History    Gravida  0   Para  0   Term  0   Preterm  0   AB  0   Living  0     SAB  0   IAB  0   Ectopic  0   Multiple  0   Live Births  0            Home Medications    Prior to Admission medications   Medication Sig Start Date End Date Taking? Authorizing Provider  BUPROPION HBR ER PO Take by mouth.    [provider]  clindamycin (CLEOCIN) 300 MG capsule Take 1 capsule (300 mg total) by mouth 3 (three) times daily. X 7 days 03/16/20   Charlynne Pander, MD  FLUoxetine (PROZAC) 40 MG capsule Take 40 mg by mouth daily.    [provider]  levonorgestrel-ethinyl estradiol (NORDETTE) 0.15-30 MG-MCG tablet Take 1 tablet by mouth daily.    [provider]  ondansetron (ZOFRAN) 4 MG tablet Take 1 tablet (4 mg total) by mouth every 6 (six) hours. 06/23/19   Hall-Potvin, [place], PA-C  omeprazole (PRILOSEC) 40 MG capsule Take 1  capsule (40 mg total) by mouth daily. 03/05/19 04/12/19  Dartha Lodge, PA-C  sucralfate (CARAFATE) 1 GM/10ML suspension Take 10 mLs (1 g total) by mouth 4 (four) times daily -  with meals and at bedtime. 03/05/19 04/12/19  Dartha Lodge, PA-C    Family History History reviewed. No pertinent family history.  Social History Social History   Tobacco Use  . Smoking status: Never Smoker  . Smokeless tobacco: Never Used  Vaping Use  . Vaping Use: Every day  Substance Use Topics  . Alcohol use: Yes    Comment: occ  . Drug use: Never     Allergies   Apple, Kiwi extract, and Strawberry extract   Review of Systems Review of Systems  Constitutional: Negative for chills and fever.  HENT: Negative for ear pain and sore throat.   Eyes: Negative for pain and visual disturbance.  Respiratory: Negative for cough and shortness of breath.   Cardiovascular: Positive for chest pain. Negative for palpitations.  Gastrointestinal: Positive for abdominal pain. Negative for constipation, diarrhea, nausea and vomiting.  Genitourinary: Positive for dysuria. Negative for hematuria, pelvic pain and vaginal discharge.  Musculoskeletal: Positive for back pain. Negative for arthralgias.  Skin: Negative for  color change and rash.  Neurological: Negative for seizures and syncope.  All other systems reviewed and are negative.    Physical Exam Triage Vital Signs ED Triage Vitals [07/08/20 1254]  Enc Vitals Group     BP 108/72     Pulse Rate (!) 103     Resp 16     Temp 97.7 F (36.5 C)     Temp Source Oral     SpO2 98 %     Weight      Height      Head Circumference      Peak Flow      Pain Score 7     Pain Loc      Pain Edu?      Excl. in GC?    No data found.  Updated Vital Signs BP 108/72 (BP Location: Left Arm)   Pulse (!) 103   Temp 97.7 F (36.5 C) (Oral)   Resp 16   LMP 07/01/2020   SpO2 98%   Visual Acuity Right Eye Distance:   Left Eye Distance:   Bilateral  Distance:    Right Eye Near:   Left Eye Near:    Bilateral Near:     Physical Exam Vitals and nursing note reviewed.  Constitutional:      General: She is not in acute distress.    Appearance: She is well-developed. She is not ill-appearing.  HENT:     Head: Normocephalic and atraumatic.     Mouth/Throat:     Mouth: Mucous membranes are moist.  Eyes:     Conjunctiva/sclera: Conjunctivae normal.  Cardiovascular:     Rate and Rhythm: Normal rate and regular rhythm.     Heart sounds: Normal heart sounds.  Pulmonary:     Effort: Pulmonary effort is normal. No respiratory distress.     Breath sounds: Normal breath sounds.  Abdominal:     General: Bowel sounds are normal.     Palpations: Abdomen is soft.     Tenderness: There is no abdominal tenderness. There is no right CVA tenderness, left CVA tenderness, guarding or rebound.  Musculoskeletal:     Cervical back: Neck supple.  Skin:    General: Skin is warm and dry.  Neurological:     General: No focal deficit present.     Mental Status: She is alert and oriented to person, place, and time.     Gait: Gait normal.  Psychiatric:        Mood and Affect: Mood normal.        Behavior: Behavior normal.      UC Treatments / Results  Labs (all labs ordered are listed, but only abnormal results are displayed) Labs Reviewed  POCT URINALYSIS DIP (MANUAL ENTRY) - Abnormal; Notable for the following components:      Result Value   Blood, UA trace-intact (*)    Leukocytes, UA Trace (*)    All other components within normal limits  URINE CULTURE  POCT URINE PREGNANCY  CERVICOVAGINAL ANCILLARY ONLY    EKG   Radiology No results found.  Procedures Procedures (including critical care time)  Medications Ordered in UC Medications - No data to display  Initial Impression / Assessment and Plan / UC Course  I have reviewed the triage vital signs and the nursing notes.  Pertinent labs & imaging results that were available  during my care of the patient were reviewed by me and considered in my medical decision making (see chart for details).  Chest pain, generalized abdominal pain, dysuria, vaginal irritation.  EKG shows sinus rhythm, rate 75, no ST elevation, compared to previous from 2018.  Instructed patient to go to the ED for evaluation of her chest pain or other acute symptoms.  Patient obtained vaginal self swab for STD testing.  Instructed her to abstain from sexual activity until the test results are back.  Instructed her to follow-up with her PCP if her symptoms persist.  She agrees to plan of care.   Final Clinical Impressions(s) / UC Diagnoses   Final diagnoses:  Chest pain, unspecified type  Generalized abdominal pain  Dysuria  Vaginal irritation     Discharge Instructions     Go to the emergency department for evaluation of acute chest pain or other concerning symptoms.    Your vaginal tests are pending.  If your test results are positive, we will call you.  You and your sexual partner(s) may require treatment at that time.  Do not have sexual activity for at least 7 days.    Follow up with your primary care provider if your symptoms are not improving.          ED Prescriptions    None     PDMP not reviewed this encounter.   Mickie Bail, NP 07/08/20 1322

## 2020-07-08 NOTE — ED Triage Notes (Signed)
Pt present chest pain and vaginal irritation. Symptoms started 4 days ago. Pt states feels pressure in the middle of her chest and back. Pt state that she just having some discomfort in the vaginal area.

## 2020-07-08 NOTE — Discharge Instructions (Signed)
Go to the emergency department for evaluation of acute chest pain or other concerning symptoms.    Your vaginal tests are pending.  If your test results are positive, we will call you.  You and your sexual partner(s) may require treatment at that time.  Do not have sexual activity for at least 7 days.    Follow up with your primary care provider if your symptoms are not improving.

## 2020-07-09 LAB — URINE CULTURE: Culture: <10000 — AB

## 2020-07-11 LAB — CERVICOVAGINAL ANCILLARY ONLY
Bacterial Vaginitis (gardnerella): NEGATIVE
Candida Glabrata: NEGATIVE
Candida Vaginitis: NEGATIVE
Chlamydia: NEGATIVE
Comment: NEGATIVE
Comment: NEGATIVE
Comment: NEGATIVE
Comment: NEGATIVE
Comment: NEGATIVE
Comment: NORMAL
Neisseria Gonorrhea: NEGATIVE
Trichomonas: NEGATIVE

## 2020-08-17 ENCOUNTER — Ambulatory Visit
Admission: EM | Admit: 2020-08-17 | Discharge: 2020-08-17 | Disposition: A | Discharge status: Home / Self Care | Payer: Medicaid Other

## 2020-08-17 ENCOUNTER — Other Ambulatory Visit: Payer: Self-pay

## 2020-08-17 DIAGNOSIS — R112 Nausea with vomiting, unspecified: Secondary | ICD-10-CM

## 2020-08-17 DIAGNOSIS — R197 Diarrhea, unspecified: Secondary | ICD-10-CM

## 2020-08-17 MED ORDER — ONDANSETRON 4 MG PO TBDP
4.0000 mg | ORAL_TABLET | Freq: Once | ORAL | Status: AC
  Administered 2020-08-17: 4 mg via ORAL

## 2020-08-17 MED ORDER — ONDANSETRON 4 MG PO TBDP: 4.0000 mg | ORAL_TABLET | Freq: Three times a day (TID) | ORAL | 0 refills | Status: DC | PRN

## 2020-08-17 NOTE — ED Triage Notes (Signed)
Pt states woke up this am with n/v/d. States feels nausea now abdominal cramping. States thinks it was what she ate last night. States will need a work note.

## 2020-08-17 NOTE — ED Provider Notes (Signed)
[facility]    CSN: [id number] Arrival date & time: 08/17/20  1300      History   Chief Complaint Chief Complaint  Patient presents with  . Nausea    HPI Sylvia Gray is a 20 y.o. female.   Patient presenting today with acute onset of nausea vomiting diarrhea and chills that started this morning upon waking.  She denies fever, chills, cough, congestion, sore throat, chest pain, shortness of breath, dizziness, urinary symptoms, vaginal symptoms.  So far not taking anything over-the-counter for symptoms.  Tolerating liquids well since onset.  Denies any new sick contacts or chronic medical problems.  LMP 07/31/2020.     History reviewed. No pertinent past medical history.  There are no problems to display for this patient.   Past Surgical History:  Procedure Laterality Date  . WISDOM TOOTH EXTRACTION      OB History    Gravida  0   Para  0   Term  0   Preterm  0   AB  0   Living  0     SAB  0   IAB  0   Ectopic  0   Multiple  0   Live Births  0            Home Medications    Prior to Admission medications   Medication Sig Start Date End Date Taking? Authorizing Provider  ondansetron (ZOFRAN ODT) 4 MG disintegrating tablet Take 1 tablet (4 mg total) by mouth every 8 (eight) hours as needed for nausea or vomiting. 08/17/20  Yes Particia Nearing, PA-C  PARoxetine (PAXIL) 10 MG tablet Take 10 mg by mouth daily.   Yes [provider]  levonorgestrel-ethinyl estradiol (NORDETTE) 0.15-30 MG-MCG tablet Take 1 tablet by mouth daily.    [provider]  omeprazole (PRILOSEC) 40 MG capsule Take 1 capsule (40 mg total) by mouth daily. 03/05/19 04/12/19  Dartha Lodge, PA-C  sucralfate (CARAFATE) 1 GM/10ML suspension Take 10 mLs (1 g total) by mouth 4 (four) times daily -  with meals and at bedtime. 03/05/19 04/12/19  Dartha Lodge, PA-C    Family History History reviewed. No pertinent family  history.  Social History Social History   Tobacco Use  . Smoking status: Never Smoker  . Smokeless tobacco: Never Used  Vaping Use  . Vaping Use: Every day  Substance Use Topics  . Alcohol use: Yes    Comment: occ  . Drug use: Never     Allergies   Apple, Kiwi extract, and Strawberry extract   Review of Systems Review of Systems Per HPI  Physical Exam Triage Vital Signs ED Triage Vitals [08/17/20 1350]  Enc Vitals Group     BP 96/64     Pulse Rate 77     Resp 18     Temp 98.3 F (36.8 C)     Temp Source Oral     SpO2 98 %     Weight      Height      Head Circumference      Peak Flow      Pain Score 6     Pain Loc      Pain Edu?      Excl. in GC?    No data found.  Updated Vital Signs BP 96/64 (BP Location: Left Arm)   Pulse 77   Temp 98.3 F (36.8 C) (Oral)   Resp 18   LMP  07/31/2020   SpO2 98%   Visual Acuity Right Eye Distance:   Left Eye Distance:   Bilateral Distance:    Right Eye Near:   Left Eye Near:    Bilateral Near:     Physical Exam Vitals and nursing note reviewed.  Constitutional:      Appearance: Normal appearance. She is not ill-appearing.  HENT:     Head: Atraumatic.     Mouth/Throat:     Mouth: Mucous membranes are moist.     Pharynx: Oropharynx is clear.  Eyes:     Extraocular Movements: Extraocular movements intact.     Conjunctiva/sclera: Conjunctivae normal.  Cardiovascular:     Rate and Rhythm: Normal rate and regular rhythm.     Heart sounds: Normal heart sounds.  Pulmonary:     Effort: Pulmonary effort is normal.     Breath sounds: Normal breath sounds. No wheezing or rales.  Abdominal:     General: Bowel sounds are normal. There is no distension.     Palpations: Abdomen is soft.     Tenderness: There is no abdominal tenderness. There is no right CVA tenderness, left CVA tenderness or guarding.  Musculoskeletal:        General: Normal range of motion.     Cervical back: Normal range of motion and neck  supple.  Skin:    General: Skin is warm and dry.  Neurological:     Mental Status: She is alert and oriented to person, place, and time.  Psychiatric:        Mood and Affect: Mood normal.        Thought Content: Thought content normal.        Judgment: Judgment normal.      UC Treatments / Results  Labs (all labs ordered are listed, but only abnormal results are displayed) Labs Reviewed - No data to display  EKG   Radiology No results found.  Procedures Procedures (including critical care time)  Medications Ordered in UC Medications  ondansetron (ZOFRAN-ODT) disintegrating tablet 4 mg (4 mg Oral Given 08/17/20 1355)    Initial Impression / Assessment and Plan / UC Course  I have reviewed the triage vital signs and the nursing notes.  Pertinent labs & imaging results that were available during my care of the patient were reviewed by me and considered in my medical decision making (see chart for details).     Vital signs, exam very reassuring.  Given Zofran in triage due to active nausea which she states has been helping quite a bit.  Tolerating p.o. liquids well.  We will send Zofran, give work note and monitor closely over the next few days.  Suspect viral GI illness causing symptoms.  Return for acutely worsening symptoms.  Final Clinical Impressions(s) / UC Diagnoses   Final diagnoses:  Nausea vomiting and diarrhea   Discharge Instructions   None    ED Prescriptions    Medication Sig Dispense Auth. Provider   ondansetron (ZOFRAN ODT) 4 MG disintegrating tablet Take 1 tablet (4 mg total) by mouth every 8 (eight) hours as needed for nausea or vomiting. 20 tablet Particia Nearing, [place]     PDMP not reviewed this encounter.   Particia Nearing, [place] 08/17/20 1452

## 2020-10-07 ENCOUNTER — Ambulatory Visit (HOSPITAL_COMMUNITY)
Admission: AD | Admit: 2020-10-07 | Discharge: 2020-10-07 | Disposition: A | Discharge status: Home / Self Care | Payer: Medicaid Other | Attending: Psychiatry | Admitting: Psychiatry

## 2020-10-07 NOTE — H&P (Signed)
Behavioral Health Medical Screening Exam  Sylvia Gray is an 20 y.o. female who presented [facility] voluntarily as a walk-in to establish care for outpatient resources.   Patient reports recently being seen by "Bethany" and feeling they aren't following up. Patient is requesting information to become established for outpatient services including therapy and possibly medication management.   On assessment she reports a history of depression and bipolar; states she was hospitalized 2018 for an intentional overdose (per chart review admission 06/30/2016 [facility]). Denies any attempts since. She endorses "wonky" sleeping habits including waking up at night (5-7 hours), some anhedonia, feelings of guilt and denies any thoughts of worthlessness, energy levels "fluctuating", poor concentration, decreased appetite and denies any psychomotor changes or suicidal ideations. She denies any homicidal ideations, auditory or visual hallucination. Denies any periods of distractibility, indiscretion, grandiosity, flight of ideas, sleep deficit, or talkativeness. States she smokes weed 1-2 per day; denies any other illicit substance use. She states she has Medicaid and currently lives in [place].   Provider discussed at length [facility] services; patient expressed interest and provided requested resources.   Total Time spent with patient: 20 minutes  Psychiatric Specialty Exam: Physical Exam Vitals and nursing note reviewed.  Constitutional:      General: She is not in acute distress.    Appearance: Normal appearance. She is normal weight. She is not ill-appearing, toxic-appearing or diaphoretic.  HENT:     Nose: Nose normal.     Mouth/Throat:     Mouth: Mucous membranes are dry.     Pharynx: Oropharynx is clear.  Pulmonary:     Effort: Pulmonary effort is normal.     Breath sounds: Normal breath sounds.  Abdominal:     General: Abdomen is flat.  Musculoskeletal:        General: Normal range of  motion.     Cervical back: Normal range of motion.  Skin:    General: Skin is warm and dry.  Neurological:     General: No focal deficit present.     Mental Status: She is alert and oriented to person, place, and time. Mental status is at baseline.  Psychiatric:        Attention and Perception: Attention and perception normal. She is attentive. She does not perceive auditory or visual hallucinations.        Mood and Affect: Mood and affect normal.        Speech: Speech normal.        Behavior: Behavior normal. Behavior is cooperative.        Thought Content: Thought content normal. Thought content is not paranoid or delusional. Thought content does not include homicidal or suicidal ideation. Thought content does not include homicidal or suicidal plan.        Cognition and Memory: Cognition and memory normal.        Judgment: Judgment normal.   Review of Systems  Constitutional:  Negative for activity change, appetite change, chills, diaphoresis, fatigue, fever and unexpected weight change.  Respiratory:  Negative for chest tightness, shortness of breath, wheezing and stridor.   Psychiatric/Behavioral:  Negative for agitation, behavioral problems, dysphoric mood, hallucinations, self-injury, sleep disturbance and suicidal ideas. The patient is not nervous/anxious.   All other systems reviewed and are negative. There were no vitals taken for this visit.There is no height or weight on file to calculate BMI. General Appearance: Well Groomed Eye Contact:  Good Speech:  Clear and Coherent Volume:  Normal Mood:  Euthymic Affect:  Appropriate and Congruent Thought Process:  Coherent Orientation:  Full (Time, Place, and Person) Thought Content:  WDL and Logical Suicidal Thoughts:  No Homicidal Thoughts:  No Memory:  Immediate;   Good Recent;   Good Remote;   Good Judgement:  Intact Insight:  Good and Present Psychomotor Activity:  Normal Concentration: Concentration: Fair and Attention  Span: Fair Recall:  [employer] of Knowledge:Fair Language: Fair Akathisia:  NA Handed:   AIMS (if indicated):    Assets:  Communication Skills Desire for Improvement Financial Resources/Insurance Housing Intimacy Leisure Time Physical Health Resilience Social Support Talents/Skills Transportation Vocational/Educational Sleep:     Musculoskeletal: Strength & Muscle Tone: within normal limits Gait & Station: normal Patient leans: N/A  There were no vitals taken for this visit.  Recommendations: Based on my evaluation the patient does not appear to have an emergency medical condition.  Loletta Parish, NP 10/07/2020, 2:37 PM

## 2020-10-07 NOTE — BH Assessment (Signed)
Patient was seen this date as a voluntary walk in at [facility]. Patient denies any S/I, H/I or AVH. Patient had been receiving OP services from [facility] for ongoing depression/anxiety although patient states they no longer accept her insurance. Patient  is requesting resources to assist with ongoing care. Patient was evaluated by Milinda Antis NP who recommended patient be discharged with OP resources. This Clinical research associate spoke to patient at length in reference to area providers and was provided with resources prior to discharge.

## 2020-10-25 ENCOUNTER — Inpatient Hospital Stay (HOSPITAL_COMMUNITY)
Admission: AD | Admit: 2020-10-25 | Discharge: 2020-10-25 | Disposition: A | Discharge status: Home / Self Care | Payer: Medicaid Other | Attending: Obstetrics & Gynecology | Admitting: Obstetrics & Gynecology

## 2020-10-25 ENCOUNTER — Other Ambulatory Visit: Payer: Self-pay

## 2020-10-25 DIAGNOSIS — N939 Abnormal uterine and vaginal bleeding, unspecified: Secondary | ICD-10-CM | POA: Diagnosis not present

## 2020-10-25 DIAGNOSIS — Z3202 Encounter for pregnancy test, result negative: Secondary | ICD-10-CM | POA: Insufficient documentation

## 2020-10-25 DIAGNOSIS — R109 Unspecified abdominal pain: Secondary | ICD-10-CM | POA: Diagnosis present

## 2020-10-25 LAB — POCT PREGNANCY, URINE: Preg Test, Ur: NEGATIVE

## 2020-10-25 LAB — HCG, QUANTITATIVE, PREGNANCY: hCG, Beta Chain, Quant, S: <1 m[IU]/mL (ref <5)

## 2020-10-25 NOTE — MAU Note (Signed)
Sylvia Gray is a 20 y.o. here in MAU reporting: for the past couple of days she has had light vaginal bleeding, cramping, and breast tenderness. Bleeding is mostly when she wipes. Reports + upt yesterday.  LMP: 10/09/20  Onset of complaint: ongoing  Pain score: 7/10  Vitals:   10/25/20 1337  BP: 108/72  Pulse: 75  Resp: 16  Temp: 98.8 F (37.1 C)  SpO2: 100%     Lab orders placed from triage: upt

## 2020-10-25 NOTE — MAU Provider Note (Signed)
Event Date/Time   First Provider Initiated Contact with Patient 10/25/20 1509      S Ms. Sylvia Gray is a 20 y.o. G0P0000 patient who presents to MAU today with complaint of vaginal spotting, abdominal cramping and breast tenderness. Patient endorses positive home pregnancy test yesterday. LMP 10/09/2020.  O BP 108/72 (BP Location: Right Arm)   Pulse 75   Temp 98.8 F (37.1 C) (Oral)   Resp 16   LMP 10/09/2020   SpO2 100% Comment: room air   Physical Exam Vitals and nursing note reviewed. Exam conducted with a chaperone present.  Constitutional:      Appearance: She is well-developed. She is not ill-appearing.  Cardiovascular:     Rate and Rhythm: Normal rate.  Pulmonary:     Effort: Pulmonary effort is normal.  Abdominal:     Palpations: Abdomen is soft.     Tenderness: There is no abdominal tenderness.  Skin:    Capillary Refill: Capillary refill takes less than 2 seconds.  Neurological:     Mental Status: She is alert and oriented to person, place, and time.  Psychiatric:        Mood and Affect: Mood normal.        Behavior: Behavior normal.    A Medical screening exam complete Negative urine pregnancy test Negative Quant hCG LMP 10/09/2020 (16 days ago) without history of irregular periods  Results for orders placed or performed during the hospital encounter of 10/25/20 (from the past 24 hour(s))  Pregnancy, urine POC     Status: None   Collection Time: 10/25/20  1:34 PM  Result Value Ref Range   Preg Test, Ur NEGATIVE NEGATIVE  hCG, quantitative, pregnancy     Status: None   Collection Time: 10/25/20  2:03 PM  Result Value Ref Range   hCG, Beta Chain, Quant, S <1 <5 mIU/mL     P Discharge from MAU in stable condition Options for follow-up reviewed with patient  Clayton Bibles, [facility] 10/25/2020 4:57 PM

## 2021-03-12 ENCOUNTER — Encounter (HOSPITAL_BASED_OUTPATIENT_CLINIC_OR_DEPARTMENT_OTHER): Payer: Self-pay | Admitting: *Deleted

## 2021-03-12 ENCOUNTER — Emergency Department (HOSPITAL_BASED_OUTPATIENT_CLINIC_OR_DEPARTMENT_OTHER)
Admission: EM | Admit: 2021-03-12 | Discharge: 2021-03-12 | Disposition: A | Discharge status: Home / Self Care | Payer: Medicaid Other | Attending: Emergency Medicine | Admitting: Emergency Medicine

## 2021-03-12 ENCOUNTER — Emergency Department (HOSPITAL_BASED_OUTPATIENT_CLINIC_OR_DEPARTMENT_OTHER)
Admission: EM | Admit: 2021-03-12 | Discharge: 2021-03-12 | Disposition: A | Discharge status: Home / Self Care | Payer: Medicaid Other | Source: Home / Self Care | Attending: Emergency Medicine | Admitting: Emergency Medicine

## 2021-03-12 ENCOUNTER — Encounter (HOSPITAL_BASED_OUTPATIENT_CLINIC_OR_DEPARTMENT_OTHER): Payer: Self-pay

## 2021-03-12 ENCOUNTER — Other Ambulatory Visit: Payer: Self-pay

## 2021-03-12 DIAGNOSIS — L509 Urticaria, unspecified: Secondary | ICD-10-CM

## 2021-03-12 DIAGNOSIS — R21 Rash and other nonspecific skin eruption: Secondary | ICD-10-CM | POA: Diagnosis present

## 2021-03-12 HISTORY — DX: Anxiety disorder, unspecified: F41.9

## 2021-03-12 HISTORY — DX: Bipolar disorder, unspecified: F31.9

## 2021-03-12 HISTORY — DX: Depression, unspecified: F32.A

## 2021-03-12 MED ORDER — METHYLPREDNISOLONE SODIUM SUCC 125 MG IJ SOLR
125.0000 mg | Freq: Once | INTRAMUSCULAR | Status: AC
  Administered 2021-03-12: 10:00:00 125 mg via INTRAVENOUS
  Filled 2021-03-12: qty 2

## 2021-03-12 MED ORDER — FAMOTIDINE 20 MG PO TABS: 20.0000 mg | ORAL_TABLET | Freq: Every day | ORAL | 0 refills | Status: DC

## 2021-03-12 MED ORDER — FAMOTIDINE IN NACL 20-0.9 MG/50ML-% IV SOLN
20.0000 mg | Freq: Once | INTRAVENOUS | Status: AC
  Administered 2021-03-12: 10:00:00 20 mg via INTRAVENOUS
  Filled 2021-03-12: qty 50

## 2021-03-12 MED ORDER — DIPHENHYDRAMINE HCL 50 MG/ML IJ SOLN
25.0000 mg | Freq: Once | INTRAMUSCULAR | Status: AC
  Administered 2021-03-12: 10:00:00 25 mg via INTRAVENOUS
  Filled 2021-03-12: qty 1

## 2021-03-12 MED ORDER — DIPHENHYDRAMINE HCL 25 MG PO TABS: 25.0000 mg | ORAL_TABLET | Freq: Four times a day (QID) | ORAL | 0 refills | Status: DC

## 2021-03-12 NOTE — ED Provider Notes (Signed)
[facility] EMERGENCY DEPARTMENT Provider Note   CSN: [id number] Arrival date & time: 03/12/21  4401     History Chief Complaint  Patient presents with   Rash    Sylvia Gray is a 20 y.o. female.  Presenting to the emergency room with concern for rash.  Reports that rash ongoing for the past day or so.  Has spread from face, arms, abdomen, legs and around groin region.  No involvement of lips or tongue or mouth.  She states that she started lamotrigine 2 weeks ago from her psychiatrist.  No other new medications.  No new foods or soaps or laundry detergents.  Has not had rash like this previously.  Rash is somewhat itchy.  Has not noted any sloughing of skin.  Otherwise feels okay.  Denies difficulty breathing, throat swelling, abdominal pain, vomiting.  HPI     History reviewed. No pertinent past medical history.  There are no problems to display for this patient.   Past Surgical History:  Procedure Laterality Date   WISDOM TOOTH EXTRACTION       OB History     Gravida  0   Para  0   Term  0   Preterm  0   AB  0   Living  0      SAB  0   IAB  0   Ectopic  0   Multiple  0   Live Births  0           History reviewed. No pertinent family history.  Social History   Tobacco Use   Smoking status: Never   Smokeless tobacco: Never  Vaping Use   Vaping Use: Every day  Substance Use Topics   Alcohol use: Yes    Comment: occ   Drug use: Never    Home Medications Prior to Admission medications   Medication Sig Start Date End Date Taking? Authorizing Provider  levonorgestrel-ethinyl estradiol (NORDETTE) 0.15-30 MG-MCG tablet Take 1 tablet by mouth daily.   Yes [provider]  PARoxetine (PAXIL) 10 MG tablet Take 10 mg by mouth daily.   Yes [provider]  ondansetron (ZOFRAN ODT) 4 MG disintegrating tablet Take 1 tablet (4 mg total) by mouth every 8 (eight) hours as needed for nausea or vomiting.  08/17/20   Particia Nearing, PA-C  omeprazole (PRILOSEC) 40 MG capsule Take 1 capsule (40 mg total) by mouth daily. 03/05/19 04/12/19  Dartha Lodge, PA-C  sucralfate (CARAFATE) 1 GM/10ML suspension Take 10 mLs (1 g total) by mouth 4 (four) times daily -  with meals and at bedtime. 03/05/19 04/12/19  Dartha Lodge, PA-C    Allergies    Apple, Kiwi extract, Strawberry extract, and Lamotrigine  Review of Systems   Review of Systems  Constitutional:  Negative for chills and fever.  HENT:  Negative for ear pain and sore throat.   Eyes:  Negative for pain and visual disturbance.  Respiratory:  Negative for cough and shortness of breath.   Cardiovascular:  Negative for chest pain and palpitations.  Gastrointestinal:  Negative for abdominal pain and vomiting.  Genitourinary:  Negative for dysuria and hematuria.  Musculoskeletal:  Negative for arthralgias and back pain.  Skin:  Positive for rash. Negative for color change.  Neurological:  Negative for seizures and syncope.  All other systems reviewed and are negative.  Physical Exam Updated Vital Signs BP 96/61   Pulse 74   Temp 98.2 F (36.8  C) (Oral)   Resp 16   Ht 5\' 3"  (1.6 m)   Wt 49.9 kg   SpO2 98%   BMI 19.49 kg/m   Physical Exam Vitals and nursing note reviewed.  Constitutional:      General: She is not in acute distress.    Appearance: She is well-developed.  HENT:     Head: Normocephalic and atraumatic.  Eyes:     Conjunctiva/sclera: Conjunctivae normal.  Cardiovascular:     Rate and Rhythm: Normal rate and regular rhythm.     Heart sounds: No murmur heard. Pulmonary:     Effort: Pulmonary effort is normal. No respiratory distress.     Breath sounds: Normal breath sounds.  Abdominal:     Palpations: Abdomen is soft.     Tenderness: There is no abdominal tenderness.  Musculoskeletal:        General: No swelling.     Cervical back: Neck supple.  Skin:    General: Skin is warm and dry.     Capillary  Refill: Capillary refill takes less than 2 seconds.     Comments: There is diffuse urticarial rash noted to face, chest, abdomen, arms and legs.  Rashes are slightly raised, erythematous, blanchable.  There is no skin sloughing.  There is no involvement of mouth.  With RN chaperone, no rash present in external GU region.  Neurological:     Mental Status: She is alert.  Psychiatric:        Mood and Affect: Mood normal.    ED Results / Procedures / Treatments   Labs (all labs ordered are listed, but only abnormal results are displayed) Labs Reviewed - No data to display  EKG None  Radiology No results found.  Procedures Procedures   Medications Ordered in ED Medications  diphenhydrAMINE (BENADRYL) injection 25 mg (25 mg Intravenous Given 03/12/21 0951)  methylPREDNISolone sodium succinate (SOLU-MEDROL) 125 mg/2 mL injection 125 mg (125 mg Intravenous Given 03/12/21 0953)  famotidine (PEPCID) IVPB 20 mg premix (0 mg Intravenous Stopped 03/12/21 1212)    ED Course  I have reviewed the triage vital signs and the nursing notes.  Pertinent labs & imaging results that were available during my care of the patient were reviewed by me and considered in my medical decision making (see chart for details).    MDM Rules/Calculators/A&P                           20 year old presents to ER with concern for rash.  She has what appears to be most likely an urticarial rash over multiple areas of her body.  Had not taken any medicine for her symptoms prior to arrival.  Does endorse starting Lamictal 2 weeks ago.  Gave Benadryl, Pepcid and steroids and observe patient in ER.  Patient had complete resolution of her symptoms on reevaluation after medication administration.  Did not have any other associated symptoms.  Presentation not consistent with anaphylaxis.  Based on the appearance of this rash, particularly with complete resolution after this medication administration, not consistent with  Stevens-Johnson syndrome or TEN.  Instructed patient to stop Lamictal and follow-up with primary care and psychiatry.  Reviewed return precautions and discharged home.  After the discussed management above, the patient was determined to be safe for discharge.  The patient was in agreement with this plan and all questions regarding their care were answered.  ED return precautions were discussed and the patient will return to  the ED with any significant worsening of condition.   Final Clinical Impression(s) / ED Diagnoses Final diagnoses:  Urticaria    Rx / DC Orders ED Discharge Orders     None        Lucrezia Starch, MD 03/12/21 1925

## 2021-03-12 NOTE — ED Triage Notes (Signed)
Last noted began having itching in palms, then noted a rash and whelps on face, arms, abdomen, legs and buttocks.

## 2021-03-12 NOTE — ED Notes (Signed)
ED Provider at bedside. 

## 2021-03-12 NOTE — ED Provider Notes (Signed)
[facility] EMERGENCY DEPARTMENT Provider Note  CSN: [id number] Arrival date & time: 03/12/21 2209  Chief Complaint(s) Rash  HPI Sylvia Gray is a 20 y.o. female    Rash Location:  Shoulder/arm and torso Quality: itchiness   Severity:  Moderate Onset quality:  Gradual Duration: several hours. was seen for Hives, treated and discharged. hives returned 1 hour after being discharged. Chronicity:  Recurrent Context: medications   Relieved by:  Antihistamines (took 50mg  of benadryl 3 hours ago.) Associated symptoms: no fever, no headaches, no induration, no myalgias, no periorbital edema, no shortness of breath, no throat swelling, no tongue swelling, not vomiting and not wheezing    Past Medical History Past Medical History:  Diagnosis Date   Anxiety    Bipolar 1 disorder (HCC)    Depression    There are no problems to display for this patient.  Home Medication(s) Prior to Admission medications   Medication Sig Start Date End Date Taking? Authorizing Provider  diphenhydrAMINE (BENADRYL) 25 MG tablet Take 1 tablet (25 mg total) by mouth every 6 (six) hours for 5 days. 03/12/21 03/17/21 Yes Nataline Basara, 14/2/22, MD  famotidine (PEPCID) 20 MG tablet Take 1 tablet (20 mg total) by mouth daily for 5 days. 03/12/21 03/17/21 Yes Javani Spratt, 14/2/22, MD  levonorgestrel-ethinyl estradiol (NORDETTE) 0.15-30 MG-MCG tablet Take 1 tablet by mouth daily.    [provider]  ondansetron (ZOFRAN ODT) 4 MG disintegrating tablet Take 1 tablet (4 mg total) by mouth every 8 (eight) hours as needed for nausea or vomiting. 08/17/20   10/17/20, PA-C  PARoxetine (PAXIL) 10 MG tablet Take 10 mg by mouth daily.    [provider]  omeprazole (PRILOSEC) 40 MG capsule Take 1 capsule (40 mg total) by mouth daily. 03/05/19 04/12/19  04/14/19, PA-C  sucralfate (CARAFATE) 1 GM/10ML suspension Take 10 mLs (1 g total) by mouth 4 (four) times daily  -  with meals and at bedtime. 03/05/19 04/12/19  04/14/19, PA-C                                                                                                                                    Past Surgical History Past Surgical History:  Procedure Laterality Date   WISDOM TOOTH EXTRACTION     Family History History reviewed. No pertinent family history.  Social History Social History   Tobacco Use   Smoking status: Never   Smokeless tobacco: Never  Vaping Use   Vaping Use: Every day  Substance Use Topics   Alcohol use: Yes    Comment: occ   Drug use: Never   Allergies Apple, Kiwi extract, Strawberry extract, and Lamotrigine  Review of Systems Review of Systems  Constitutional:  Negative for fever.  Respiratory:  Negative for shortness of breath and wheezing.   Gastrointestinal:  Negative for vomiting.  Musculoskeletal:  Negative for myalgias.  Skin:  Positive for rash.  Neurological:  Negative for headaches.  All other systems are reviewed and are negative for acute change except as noted in the HPI  Physical Exam Vital Signs  I have reviewed the triage vital signs BP 98/65 (BP Location: Right Arm)   Pulse 77   Temp 98.6 F (37 C) (Oral)   Resp 17   Ht 5\' 3"  (1.6 m)   Wt 49.9 kg   SpO2 96%   BMI 19.49 kg/m   Physical Exam Vitals reviewed.  Constitutional:      General: She is not in acute distress.    Appearance: She is well-developed. She is not diaphoretic.  HENT:     Head: Normocephalic and atraumatic.     Nose: Nose normal.  Eyes:     General: No scleral icterus.       Right eye: No discharge.        Left eye: No discharge.     Conjunctiva/sclera: Conjunctivae normal.     Pupils: Pupils are equal, round, and reactive to light.  Cardiovascular:     Rate and Rhythm: Normal rate and regular rhythm.     Heart sounds: No murmur heard.   No friction rub. No gallop.  Pulmonary:     Effort: Pulmonary effort is normal. No respiratory distress.      Breath sounds: Normal breath sounds. No stridor. No rales.  Abdominal:     General: There is no distension.     Palpations: Abdomen is soft.     Tenderness: There is no abdominal tenderness.  Musculoskeletal:        General: No tenderness.     Cervical back: Normal range of motion and neck supple.  Skin:    General: Skin is warm and dry.     Findings: No erythema or rash.  Neurological:     Mental Status: She is alert and oriented to person, place, and time.    ED Results and Treatments Labs (all labs ordered are listed, but only abnormal results are displayed) Labs Reviewed - No data to display                                                                                                                       EKG  EKG Interpretation  Date/Time:    Ventricular Rate:    PR Interval:    QRS Duration:   QT Interval:    QTC Calculation:   R Axis:     Text Interpretation:         Radiology No results found.  Pertinent labs & imaging results that were available during my care of the patient were reviewed by me and considered in my medical decision making (see MDM for details).  Medications Ordered in ED Medications - No data to display  Procedures Procedures  (including critical care time)  Medical Decision Making / ED Course I have reviewed the nursing notes for this encounter and the patient's prior records (if available in EHR or on provided paperwork).  Sylvia Gray was evaluated in Emergency Department on 03/13/2021 for the symptoms described in the history of present illness. She was evaluated in the context of the global COVID-19 pandemic, which necessitated consideration that the patient might be at risk for infection with the SARS-CoV-2 virus that causes COVID-19. Institutional protocols and algorithms that pertain  to the evaluation of patients at risk for COVID-19 are in a state of rapid change based on information released by regulatory bodies including the CDC and federal and state organizations. These policies and algorithms were followed during the patient's care in the ED.     21 y.o. female here with pruritic rash. No known triggers or exposures. No respiratory, GI, or neurologic symptoms to suggest anaphylaxis.  Patient has taken benadryl prior to arrival.   On exam, there is no evidence of oral swelling or airway compromise. Rash has resolved.  Safe for discharge with strict return precautions. Given Rx for H2 blocker. Benadryl prn.   Final Clinical Impression(s) / ED Diagnoses Final diagnoses:  Hives   The patient appears reasonably screened and/or stabilized for discharge and I doubt any other medical condition or other [facility] requiring further screening, evaluation, or treatment in the ED at this time prior to discharge. Safe for discharge with strict return precautions.  Disposition: Discharge  Condition: Good  I have discussed the results, Dx and Tx plan with the patient/family who expressed understanding and agree(s) with the plan. Discharge instructions discussed at length. The patient/family was given strict return precautions who verbalized understanding of the instructions. No further questions at time of discharge.    ED Discharge Orders          Ordered    diphenhydrAMINE (BENADRYL) 25 MG tablet  Every 6 hours        03/12/21 2325    famotidine (PEPCID) 20 MG tablet  Daily        03/12/21 2325            Follow Up: Arvilla Market, MD [address] [place] [address] [phone]  Call  to schedule an appointment for close follow up     This chart was dictated using voice recognition software.  Despite best efforts to proofread,  errors can occur which can change the documentation meaning.    [name], MD 03/13/21  [phone]

## 2021-03-12 NOTE — ED Triage Notes (Signed)
Seen here earlier today for burning and itchy rash on buttocks, abdomen, face, and BIL UE. Pt states that the IV meds that she received here helped the rash but now rash has returned. PT recently started lamotrigine and thinks that rash is caused by this.

## 2021-03-12 NOTE — Discharge Instructions (Addendum)
Please follow-up with your primary doctor and her psychiatrist.  I would recommend stopping the lamotrigine.  If you develop a rash again and it is relatively mild then take Benadryl as needed.  If you develop a more severe rash, if the rash involves your mucous membranes like your lips, tongue or you have any sloughing of your skin or significant pain or other new concerning symptom, please return to the ER immediately for reassessment.

## 2021-03-12 NOTE — ED Notes (Signed)
IVF of NS infusing at 163ml/hr

## 2021-08-24 ENCOUNTER — Emergency Department (HOSPITAL_BASED_OUTPATIENT_CLINIC_OR_DEPARTMENT_OTHER): Payer: Medicaid Other

## 2021-08-24 ENCOUNTER — Encounter (HOSPITAL_BASED_OUTPATIENT_CLINIC_OR_DEPARTMENT_OTHER): Payer: Self-pay | Admitting: Urology

## 2021-08-24 ENCOUNTER — Other Ambulatory Visit: Payer: Self-pay

## 2021-08-24 ENCOUNTER — Emergency Department (HOSPITAL_BASED_OUTPATIENT_CLINIC_OR_DEPARTMENT_OTHER)
Admission: EM | Admit: 2021-08-24 | Discharge: 2021-08-24 | Disposition: A | Discharge status: Home / Self Care | Payer: Medicaid Other | Attending: Emergency Medicine | Admitting: Emergency Medicine

## 2021-08-24 DIAGNOSIS — R109 Unspecified abdominal pain: Secondary | ICD-10-CM | POA: Diagnosis present

## 2021-08-24 DIAGNOSIS — A64 Unspecified sexually transmitted disease: Secondary | ICD-10-CM

## 2021-08-24 DIAGNOSIS — R350 Frequency of micturition: Secondary | ICD-10-CM

## 2021-08-24 LAB — CBC WITH DIFFERENTIAL/PLATELET
Abs Immature Granulocytes: 0.01 10*3/uL (ref 0.00–0.07)
Basophils Absolute: 0 10*3/uL (ref 0.0–0.1)
Basophils Relative: 1 %
Eosinophils Absolute: 0.2 10*3/uL (ref 0.0–0.5)
Eosinophils Relative: 3 %
HCT: 36.3 % (ref 36.0–46.0)
Hemoglobin: 12.2 g/dL (ref 12.0–15.0)
Immature Granulocytes: 0 %
Lymphocytes Relative: 40 %
Lymphs Abs: 2.4 10*3/uL (ref 0.7–4.0)
MCH: 32.4 pg (ref 26.0–34.0)
MCHC: 33.6 g/dL (ref 30.0–36.0)
MCV: 96.5 fL (ref 80.0–100.0)
Monocytes Absolute: 0.4 10*3/uL (ref 0.1–1.0)
Monocytes Relative: 7 %
Neutro Abs: 2.9 10*3/uL (ref 1.7–7.7)
Neutrophils Relative %: 49 %
Platelets: 203 10*3/uL (ref 150–400)
RBC: 3.76 MIL/uL — ABNORMAL LOW (ref 3.87–5.11)
RDW: 12.8 % (ref 11.5–15.5)
WBC: 5.9 10*3/uL (ref 4.0–10.5)
nRBC: 0 % (ref 0.0–0.2)

## 2021-08-24 LAB — URINALYSIS, ROUTINE W REFLEX MICROSCOPIC
Bilirubin Urine: NEGATIVE
Glucose, UA: NEGATIVE mg/dL
Hgb urine dipstick: NEGATIVE
Ketones, ur: NEGATIVE mg/dL
Nitrite: NEGATIVE
Protein, ur: NEGATIVE mg/dL
Specific Gravity, Urine: 1.025 (ref 1.005–1.030)
pH: 6.5 (ref 5.0–8.0)

## 2021-08-24 LAB — RAPID HIV SCREEN (HIV 1/2 AB+AG)
HIV 1/2 Antibodies: NONREACTIVE
HIV-1 P24 Antigen - HIV24: NONREACTIVE

## 2021-08-24 LAB — BASIC METABOLIC PANEL
Anion gap: 7 (ref 5–15)
BUN: 17 mg/dL (ref 6–20)
CO2: 28 mmol/L (ref 22–32)
Calcium: 9.3 mg/dL (ref 8.9–10.3)
Chloride: 102 mmol/L (ref 98–111)
Creatinine, Ser: 0.54 mg/dL (ref 0.44–1.00)
GFR, Estimated: >60 mL/min (ref >60)
Glucose, Bld: 106 mg/dL — ABNORMAL HIGH (ref 70–99)
Potassium: 3.8 mmol/L (ref 3.5–5.1)
Sodium: 137 mmol/L (ref 135–145)

## 2021-08-24 LAB — URINALYSIS, MICROSCOPIC (REFLEX): RBC / HPF: NONE SEEN RBC/hpf (ref 0–5)

## 2021-08-24 LAB — WET PREP, GENITAL
Clue Cells Wet Prep HPF POC: NONE SEEN
Sperm: NONE SEEN
Trich, Wet Prep: NONE SEEN
WBC, Wet Prep HPF POC: >=10 — AB (ref <10)
Yeast Wet Prep HPF POC: NONE SEEN

## 2021-08-24 LAB — PREGNANCY, URINE: Preg Test, Ur: NEGATIVE

## 2021-08-24 MED ORDER — CEFTRIAXONE SODIUM 500 MG IJ SOLR
500.0000 mg | Freq: Once | INTRAMUSCULAR | Status: AC
  Administered 2021-08-24: 500 mg via INTRAMUSCULAR
  Filled 2021-08-24: qty 500

## 2021-08-24 MED ORDER — AZITHROMYCIN 250 MG PO TABS
1000.0000 mg | ORAL_TABLET | Freq: Once | ORAL | Status: AC
  Administered 2021-08-24: 1000 mg via ORAL
  Filled 2021-08-24: qty 4

## 2021-08-24 MED ORDER — LIDOCAINE HCL (PF) 1 % IJ SOLN
1.0000 mL | Freq: Once | INTRAMUSCULAR | Status: AC
  Administered 2021-08-24: 1 mL
  Filled 2021-08-24: qty 5

## 2021-08-24 NOTE — Discharge Instructions (Addendum)
You were treated for gonorrhea and chlamydia on todays visit.  ? ?Please refrain from sexual intercourse for the next week. ? ?The results of gonorrhea and chlamydia should come back in the next 48 hours.  Please discontinue your antibiotic taken for urinary tract infection as you do not have a urinary tract infection. ? ? ?

## 2021-08-24 NOTE — ED Provider Notes (Signed)
?[facility] EMERGENCY DEPARTMENT ?Provider Note ? ? ?CSN: [id number] ?Arrival date & time: 08/24/21  1926 ? ?  ? ?History ? ?Chief Complaint  ?Patient presents with  ? Flank Pain  ? ? ?Sylvia Gray is a 21 y.o. female. ? ?21 year old female with no past medical history presents to the ED with a chief complaint of bilateral flank pain for the past month.  Patient reports she felt like she likely had a UTI at the beginning of April, reports she took an at-home test which was positive.  She began to treat herself by increasing fluids, taking some over-the-counter medication without any improvement.  Now she feels like there is some urinary frequency, a lot of pressure, she says that she is unable to fully void.  She was evaluated at urgent care approximately a week ago, she was told she did not have a UTI.  On today's visit she endorses suprapubic pain along with bilateral flank pain.  Did get evaluated she did get evaluated at [facility], they prescribed her Macrobid.  She was also tested for BV, trichomoniasis, GC however has not gotten the results back.  Orts feeling very weak, feels that the pain along the suprapubic area is exacerbated with sitting down.  She denies any fever, nausea, vomiting.  No vaginal discharge, no vaginal bleeding.  Last menstrual cycle on 08/18/2021. ? ?The history is provided by the patient.  ?Flank Pain ?Associated symptoms include abdominal pain.  ? ?  ? ?Home Medications ?Prior to Admission medications   ?Medication Sig Start Date End Date Taking? Authorizing Provider  ?diphenhydrAMINE (BENADRYL) 25 MG tablet Take 1 tablet (25 mg total) by mouth every 6 (six) hours for 5 days. 03/12/21 03/17/21  Fatima Blank, MD  ?famotidine (PEPCID) 20 MG tablet Take 1 tablet (20 mg total) by mouth daily for 5 days. 03/12/21 03/17/21  Fatima Blank, MD  ?levonorgestrel-ethinyl estradiol (NORDETTE) 0.15-30 MG-MCG tablet Take 1 tablet by mouth daily.     [provider]  ?ondansetron (ZOFRAN ODT) 4 MG disintegrating tablet Take 1 tablet (4 mg total) by mouth every 8 (eight) hours as needed for nausea or vomiting. 08/17/20   Volney American, PA-C  ?PARoxetine (PAXIL) 10 MG tablet Take 10 mg by mouth daily.    [provider]  ?omeprazole (PRILOSEC) 40 MG capsule Take 1 capsule (40 mg total) by mouth daily. 03/05/19 04/12/19  Jacqlyn Larsen, PA-C  ?sucralfate (CARAFATE) 1 GM/10ML suspension Take 10 mLs (1 g total) by mouth 4 (four) times daily -  with meals and at bedtime. 03/05/19 04/12/19  Jacqlyn Larsen, PA-C  ?   ? ?Allergies    ?Apple juice, Kiwi extract, Strawberry extract, and Lamotrigine   ? ?Review of Systems   ?Review of Systems  ?Constitutional:  Negative for chills and fever.  ?HENT:  Negative for sore throat.   ?Gastrointestinal:  Positive for abdominal pain. Negative for nausea.  ?Genitourinary:  Positive for difficulty urinating, flank pain, frequency and urgency. Negative for vaginal bleeding and vaginal discharge.  ?All other systems reviewed and are negative. ? ?Physical Exam ?Updated Vital Signs ?BP 110/78   Pulse 76   Temp 97.8 ?F (36.6 ?C) (Oral)   Resp 20   Ht 5\' 3"  (1.6 m)   Wt 49.4 kg   LMP 08/18/2021 (Exact Date)   SpO2 100%   BMI 19.31 kg/m?  ?Physical Exam ?Vitals and nursing note reviewed. Exam conducted with a chaperone present.  ?Constitutional:   ?  Appearance: Normal appearance.  ?HENT:  ?   Head: Normocephalic and atraumatic.  ?   Mouth/Throat:  ?   Mouth: Mucous membranes are moist.  ?Eyes:  ?   Pupils: Pupils are equal, round, and reactive to light.  ?Cardiovascular:  ?   Rate and Rhythm: Normal rate.  ?Abdominal:  ?   Tenderness: There is abdominal tenderness. There is right CVA tenderness and left CVA tenderness. There is no guarding.  ?Genitourinary: ?   Vagina: Vaginal discharge present.  ?   Cervix: Discharge and erythema present.  ?   Adnexa:     ?   Right: No tenderness or fullness.      ?    Left: No tenderness or fullness.    ?   Comments: Thickened amount of green discharge from the cervix, and amount of green discharge on the vaginal vault with foul odor.  Normal CMT tenderness, no adnexal tenderness or fullness.  ?Musculoskeletal:  ?   Cervical back: Normal range of motion and neck supple.  ?Skin: ?   General: Skin is warm and dry.  ?Neurological:  ?   Mental Status: She is alert and oriented to person, place, and time.  ? ? ?ED Results / Procedures / Treatments   ?Labs ?(all labs ordered are listed, but only abnormal results are displayed) ?Labs Reviewed  ?WET PREP, GENITAL - Abnormal; Notable for the following components:  ?    Result Value  ? WBC, Wet Prep HPF POC >=10 (*)   ? All other components within normal limits  ?URINALYSIS, ROUTINE W REFLEX MICROSCOPIC - Abnormal; Notable for the following components:  ? Leukocytes,Ua TRACE (*)   ? All other components within normal limits  ?BASIC METABOLIC PANEL - Abnormal; Notable for the following components:  ? Glucose, Bld 106 (*)   ? All other components within normal limits  ?CBC WITH DIFFERENTIAL/PLATELET - Abnormal; Notable for the following components:  ? RBC 3.76 (*)   ? All other components within normal limits  ?URINALYSIS, MICROSCOPIC (REFLEX) - Abnormal; Notable for the following components:  ? Bacteria, UA FEW (*)   ? All other components within normal limits  ?PREGNANCY, URINE  ?RAPID HIV SCREEN (HIV 1/2 AB+AG)  ?HIV ANTIBODY (ROUTINE TESTING W REFLEX)  ?GC/CHLAMYDIA PROBE AMP ([place]) NOT AT [facility]  ? ? ?EKG ?None ? ?Radiology ?CT Renal Stone Study ? ?Result Date: 08/24/2021 ?CLINICAL DATA:  Flank pain EXAM: CT ABDOMEN AND PELVIS WITHOUT CONTRAST TECHNIQUE: Multidetector CT imaging of the abdomen and pelvis was performed following the standard protocol without IV contrast. RADIATION DOSE REDUCTION: This exam was performed according to the departmental dose-optimization program which includes automated exposure control, adjustment of  the mA and/or kV according to patient size and/or use of iterative reconstruction technique. COMPARISON:  CT 03/05/2019 FINDINGS: Lower chest: No acute abnormality. Hepatobiliary: No focal liver abnormality is seen. No gallstones, gallbladder wall thickening, or biliary dilatation. Pancreas: Unremarkable. No pancreatic ductal dilatation or surrounding inflammatory changes. Spleen: Normal in size without focal abnormality. Adrenals/Urinary Tract: Adrenal glands are unremarkable. Kidneys are normal, without renal calculi, focal lesion, or hydronephrosis. Bladder is unremarkable. Stomach/Bowel: Stomach is within normal limits. Appendix appears normal. No evidence of bowel wall thickening, distention, or inflammatory changes. Vascular/Lymphatic: No significant vascular findings are present. No enlarged abdominal or pelvic lymph nodes. Reproductive: Uterus and bilateral adnexa are unremarkable. Other: No abdominal wall hernia or abnormality. No abdominopelvic ascites. Musculoskeletal: No acute or significant osseous findings. IMPRESSION: Negative. No CT evidence  for acute intra-abdominal or pelvic abnormality Electronically Signed   By: Donavan Foil M.D.   On: 08/24/2021 20:21   ? ?Procedures ?Procedures  ? ? ?Medications Ordered in ED ?Medications  ?cefTRIAXone (ROCEPHIN) injection 500 mg (has no administration in time range)  ?lidocaine (PF) (XYLOCAINE) 1 % injection 1-2.1 mL (has no administration in time range)  ?azithromycin (ZITHROMAX) tablet 1,000 mg (has no administration in time range)  ? ? ?ED Course/ Medical Decision Making/ A&P ?Clinical Course as of 08/24/21 2259  ?Thu Aug 24, 2021  ?2040 Leukocytes,Ua(!): TRACE [JS]  ?  ?Clinical Course User Index ?[JS] Janeece Fitting, PA-C  ? ?                        ?Medical Decision Making ?Patient with urinary symptoms for over a month.  Recently started on Macrobid by [facility] for suspected UTI.  However, patient had negative test at urgent care.  On today's  visit, she endorses worsening bilateral flank pain, feels like she is overall weak, has some urinary urgency and frequency, states that she feels that she is not unable to fully void. ?Labs are unremarkable.  CT is

## 2021-08-24 NOTE — ED Triage Notes (Signed)
Bilateral flank pain x 1 month ?Feels like she has a UTI ?Urinary frequency and " pressure" with urination  ?Started antibiotics (macrobid) on Tuesday  ? ?

## 2021-08-28 LAB — GC/CHLAMYDIA PROBE AMP (~~LOC~~) NOT AT ARMC
Chlamydia: POSITIVE — AB
Comment: NEGATIVE
Comment: NORMAL
Neisseria Gonorrhea: NEGATIVE

## 2021-10-18 ENCOUNTER — Other Ambulatory Visit: Payer: Self-pay

## 2021-10-18 ENCOUNTER — Encounter (HOSPITAL_COMMUNITY): Payer: Self-pay | Admitting: Registered Nurse

## 2021-10-18 ENCOUNTER — Ambulatory Visit (HOSPITAL_COMMUNITY)
Admission: EM | Admit: 2021-10-18 | Discharge: 2021-10-18 | Disposition: A | Discharge status: Other Acute Inpatient Hospital | Payer: Medicaid Other | Attending: Registered Nurse | Admitting: Registered Nurse

## 2021-10-18 DIAGNOSIS — R45851 Suicidal ideations: Secondary | ICD-10-CM | POA: Insufficient documentation

## 2021-10-18 DIAGNOSIS — F419 Anxiety disorder, unspecified: Secondary | ICD-10-CM | POA: Insufficient documentation

## 2021-10-18 DIAGNOSIS — Z20822 Contact with and (suspected) exposure to covid-19: Secondary | ICD-10-CM | POA: Insufficient documentation

## 2021-10-18 DIAGNOSIS — F332 Major depressive disorder, recurrent severe without psychotic features: Secondary | ICD-10-CM

## 2021-10-18 DIAGNOSIS — Z9151 Personal history of suicidal behavior: Secondary | ICD-10-CM | POA: Diagnosis not present

## 2021-10-18 DIAGNOSIS — F319 Bipolar disorder, unspecified: Secondary | ICD-10-CM | POA: Diagnosis not present

## 2021-10-18 DIAGNOSIS — T50902A Poisoning by unspecified drugs, medicaments and biological substances, intentional self-harm, initial encounter: Secondary | ICD-10-CM

## 2021-10-18 LAB — URINALYSIS, ROUTINE W REFLEX MICROSCOPIC
Bilirubin Urine: NEGATIVE
Glucose, UA: NEGATIVE mg/dL
Hgb urine dipstick: NEGATIVE
Ketones, ur: NEGATIVE mg/dL
Leukocytes,Ua: NEGATIVE
Nitrite: NEGATIVE
Protein, ur: 100 mg/dL — AB
Specific Gravity, Urine: 1.024 (ref 1.005–1.030)
pH: 5 (ref 5.0–8.0)

## 2021-10-18 LAB — ETHANOL: Alcohol, Ethyl (B): <10 mg/dL (ref <10)

## 2021-10-18 LAB — COMPREHENSIVE METABOLIC PANEL
ALT: 14 U/L (ref 0–44)
AST: 18 U/L (ref 15–41)
Albumin: 4.5 g/dL (ref 3.5–5.0)
Alkaline Phosphatase: 53 U/L (ref 38–126)
Anion gap: 10 (ref 5–15)
BUN: 12 mg/dL (ref 6–20)
CO2: 25 mmol/L (ref 22–32)
Calcium: 9.5 mg/dL (ref 8.9–10.3)
Chloride: 104 mmol/L (ref 98–111)
Creatinine, Ser: 0.79 mg/dL (ref 0.44–1.00)
GFR, Estimated: >60 mL/min (ref >60)
Glucose, Bld: 104 mg/dL — ABNORMAL HIGH (ref 70–99)
Potassium: 3.8 mmol/L (ref 3.5–5.1)
Sodium: 139 mmol/L (ref 135–145)
Total Bilirubin: 0.8 mg/dL (ref 0.3–1.2)
Total Protein: 7.6 g/dL (ref 6.5–8.1)

## 2021-10-18 LAB — CBC WITH DIFFERENTIAL/PLATELET
Abs Immature Granulocytes: 0.01 10*3/uL (ref 0.00–0.07)
Basophils Absolute: 0 10*3/uL (ref 0.0–0.1)
Basophils Relative: 1 %
Eosinophils Absolute: 0 10*3/uL (ref 0.0–0.5)
Eosinophils Relative: 1 %
HCT: 40.9 % (ref 36.0–46.0)
Hemoglobin: 13.3 g/dL (ref 12.0–15.0)
Immature Granulocytes: 0 %
Lymphocytes Relative: 39 %
Lymphs Abs: 2 10*3/uL (ref 0.7–4.0)
MCH: 31.4 pg (ref 26.0–34.0)
MCHC: 32.5 g/dL (ref 30.0–36.0)
MCV: 96.5 fL (ref 80.0–100.0)
Monocytes Absolute: 0.4 10*3/uL (ref 0.1–1.0)
Monocytes Relative: 7 %
Neutro Abs: 2.7 10*3/uL (ref 1.7–7.7)
Neutrophils Relative %: 52 %
Platelets: 235 10*3/uL (ref 150–400)
RBC: 4.24 MIL/uL (ref 3.87–5.11)
RDW: 13 % (ref 11.5–15.5)
WBC: 5.2 10*3/uL (ref 4.0–10.5)
nRBC: 0 % (ref 0.0–0.2)

## 2021-10-18 LAB — RESP PANEL BY RT-PCR (FLU A&B, COVID) ARPGX2
Influenza A by PCR: NEGATIVE
Influenza B by PCR: NEGATIVE
SARS Coronavirus 2 by RT PCR: NEGATIVE

## 2021-10-18 LAB — POCT URINE DRUG SCREEN - MANUAL ENTRY (I-SCREEN)
POC Amphetamine UR: NOT DETECTED
POC Buprenorphine (BUP): NOT DETECTED
POC Cocaine UR: NOT DETECTED
POC Marijuana UR: POSITIVE — AB
POC Methadone UR: NOT DETECTED
POC Methamphetamine UR: NOT DETECTED
POC Morphine: NOT DETECTED
POC Oxazepam (BZO): NOT DETECTED
POC Oxycodone UR: NOT DETECTED
POC Secobarbital (BAR): NOT DETECTED

## 2021-10-18 LAB — PREGNANCY, URINE: Preg Test, Ur: NEGATIVE

## 2021-10-18 LAB — LIPID PANEL
Cholesterol: 217 mg/dL — ABNORMAL HIGH (ref 0–200)
HDL: 56 mg/dL (ref >40)
LDL Cholesterol: 151 mg/dL — ABNORMAL HIGH (ref 0–99)
Total CHOL/HDL Ratio: 3.9 RATIO
Triglycerides: 52 mg/dL (ref <150)
VLDL: 10 mg/dL (ref 0–40)

## 2021-10-18 LAB — TSH: TSH: 0.319 u[IU]/mL — ABNORMAL LOW (ref 0.350–4.500)

## 2021-10-18 LAB — POC SARS CORONAVIRUS 2 AG: SARSCOV2ONAVIRUS 2 AG: NEGATIVE

## 2021-10-18 LAB — HEMOGLOBIN A1C
Hgb A1c MFr Bld: 4.6 % — ABNORMAL LOW (ref 4.8–5.6)
Mean Plasma Glucose: 85.32 mg/dL

## 2021-10-18 LAB — MAGNESIUM: Magnesium: 2.2 mg/dL (ref 1.7–2.4)

## 2021-10-18 LAB — POCT PREGNANCY, URINE: Preg Test, Ur: NEGATIVE

## 2021-10-18 MED ORDER — TRAZODONE HCL 50 MG PO TABS
50.0000 mg | ORAL_TABLET | Freq: Every evening | ORAL | Status: DC | PRN
  Administered 2021-10-18: 50 mg via ORAL
  Filled 2021-10-18: qty 1

## 2021-10-18 MED ORDER — ALUM & MAG HYDROXIDE-SIMETH 200-200-20 MG/5ML PO SUSP: 30.0000 mL | ORAL | Status: DC | PRN

## 2021-10-18 MED ORDER — HYDROXYZINE HCL 25 MG PO TABS
25.0000 mg | ORAL_TABLET | Freq: Three times a day (TID) | ORAL | Status: DC | PRN
  Administered 2021-10-18: 25 mg via ORAL
  Filled 2021-10-18: qty 1

## 2021-10-18 MED ORDER — DOXYCYCLINE HYCLATE 100 MG PO TABS
100.0000 mg | ORAL_TABLET | Freq: Two times a day (BID) | ORAL | Status: DC
  Administered 2021-10-18: 100 mg via ORAL
  Filled 2021-10-18: qty 1

## 2021-10-18 MED ORDER — MAGNESIUM HYDROXIDE 400 MG/5ML PO SUSP: 30.0000 mL | Freq: Every day | ORAL | Status: DC | PRN

## 2021-10-18 MED ORDER — ACETAMINOPHEN 325 MG PO TABS: 650.0000 mg | ORAL_TABLET | Freq: Four times a day (QID) | ORAL | Status: DC | PRN

## 2021-10-18 NOTE — Progress Notes (Signed)
   10/18/21 1717  [facility] Triage Screening (Walk-ins at [facility] only)  How Did You Hear About [place]? Family/Friend  What Is the Reason for Your Visit/Call Today? 21 year old Sylvia Gray self-report suicidal intent via taking six 800mg  of ibuprofen around 3pm today. Patient denies vomiting or any side effects after indigestion of taking the pills. The intent was impulsive. Patient report 1x prior suicidal intent in 2018. Suicidal intent triggered by finanical complications, feeling not heard, recently contracted chlamydia from boyfriend and  pelvic inflammatory disease. Patient currently denied feeling suicidal expressing, "I want help."  Denied homicidal ideations and denied auditory/visual hallucinations. Prior dx Bipolar.  How Long Has This Been Causing You Problems? <Week  Have You Recently Had Any Thoughts About Hurting Yourself? Yes  How long ago did you have thoughts about hurting yourself? Patient attempted suicidal today 10/18/2021 via taking six 800mg  of ibuprofen  Are You Planning to Commit Suicide/Harm Yourself At This time? No  Have you Recently Had Thoughts About Hurting Someone 12/19/2021? No  Are You Planning To Harm Someone At This Time? No  Are you currently experiencing any auditory, visual or other hallucinations? No  Have You Used Any Alcohol or Drugs in the Past 24 Hours? No  Do you have any current medical co-morbidities that require immediate attention? No  Clinician description of patient physical appearance/behavior: Patient present crying with a depressed affect  What Do You Feel Would Help You the Most Today? Stress Management  If access to [facility] was not available, would you have sought care in the Emergency Department? Yes  Determination of Need Emergent (2 hours)  Options For Referral Inpatient Hospitalization
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
## 2021-10-18 NOTE — ED Notes (Signed)
Pt arrived to the unit.

## 2021-10-18 NOTE — ED Provider Notes (Signed)
[facility] H&P  Date: 10/18/21 Patient Name: Sylvia Gray MRN: [id number] Chief Complaint:  Chief Complaint  Patient presents with   Suicidal    21 year old Sylvia PostStephanie Gray self-report suicidal intent via taking six 800mg  of ibuprofen around 3pm today. Patient denies vomiting or any side effects. The intent was impulsive. Patient report 1x prior suicidal intent in 2018. Denied homicidal ideations and denied auditory/visual hallucinations.       Diagnoses:  Final diagnoses:  MDD (major depressive disorder), recurrent severe, without psychosis (HCC)  Suicide attempt by drug overdose (HCC)  Suicidal ideation    HPI: Sylvia Gray 21 y.o., female patient [facility] as a walk in accompanied by several family members with complaints of suicidal ideation and suicide attempt earlier today.  Sylvia Gray, 21 y.o., female patient seen face to face by this provider, consulted with Dr. Earlene PlaterKatherine Laubach; and chart reviewed on 10/18/21.  On evaluation Sylvia Gray reports she came in today related to feeling suicidal and attempting suicide earlier today by taking 6 tablets of 800 mg Ibuprofen around 3:00 pm.  States it was an impulsive act and called her stepmother after taking the pills.  Patient denies any symptoms currently.  Patient reporting her stressors financial complications, recent "chlamydia and pelvic inflammatory infection" from her boyfriend; and feeling like she is not here; states when she tries to talk to someone feel they talk over her or make the conversations about them.  States she also feels dirty.  Patient reports prior history of bipolar disorder and has been on multiple psychotropic medications that didn't work "The last medicine I was taking the doctor told me if I didn't take it right, I could die; and I was taking it right  and broke out in a rash and had to stop taking it (allergic reaction to Lamotrigine).  She has also been on Lexapro, Prozac, and Latuda).  Reports one prior suicide attempt in 2018.  Currently, she doesn't have any outpatient psychiatric services.  Patient denies homicidal, psychosis, and paranoia.  Patient states she is employed and lives with roommate.  Family supportive During evaluation Sylvia Gray is sitting upright in chair with no noted distress.  She is alert, oriented x 4, calm, cooperative and attentive.  Her mood is depressed and tearful with congruent affect.  She has normal speech, and behavior.  Objectively there is no evidence of psychosis, mania or delusional thinking.  She is able to converse coherently, goal directed thoughts, no distractibility, or pre-occupation.  She also denies homicidal ideation, psychosis, and paranoia.  Continues to endorse suicidal ideation.  Inpatient psychiatric treatment recommended.    PHQ 2-9:  Flowsheet Row [facility]  Thoughts that you would be better off dead, or of hurting yourself in some way Nearly every day  PHQ-9 Total Score 12       Flowsheet Row [facility] from 08/24/2021 in [facility] ED from 03/12/2021 in [facility]  C-SSRS RISK CATEGORY High Risk No Risk No Risk        Total Time spent with patient: 45 minutes  Musculoskeletal  Strength & Muscle Tone: within normal limits Gait & Station: normal Patient leans: N/A  Psychiatric Specialty Exam  Presentation General Appearance: Appropriate for Environment; Casual  Eye Contact:Good  Speech:Clear and Coherent; Normal Rate  Speech Volume:Normal  Handedness:Right   Mood and Affect  Mood:Depressed  Affect:Congruent; Tearful; Depressed   Thought Process  Thought Processes:Coherent; Goal  Directed  Descriptions of Associations:Intact  Orientation:Full (Time, Place and Person)  Thought Content:Logical  Diagnosis of Schizophrenia or Schizoaffective disorder in past: No   Hallucinations:Hallucinations: None  Ideas of Reference:None  Suicidal Thoughts:Suicidal Thoughts: Yes, Active SI Active Intent and/or Plan: With Intent; With Plan  Homicidal Thoughts:Homicidal Thoughts: No   Sensorium  Memory:Immediate Good; Recent Good; Remote Good  Judgment:Fair  Insight:Fair   Executive Functions  Concentration:Good  Attention Span:Good  Recall:Good  Fund of Knowledge:Good  Language:Good   Psychomotor Activity  Psychomotor Activity:Psychomotor Activity: Normal   Assets  Assets:Communication Skills; Desire for Improvement; Housing; Physical Health; Resilience; Social Support   Sleep  Sleep:Sleep: Good   Nutritional Assessment (For OBS and FBC admissions only) Has the patient had a weight loss or gain of 10 pounds or more in the last 3 months?: No Has the patient had a decrease in food intake/or appetite?: No Does the patient have dental problems?: No Does the patient have eating habits or behaviors that may be indicators of an eating disorder including binging or inducing vomiting?: No Has the patient recently lost weight without trying?: 0 Has the patient been eating poorly because of a decreased appetite?: 0 Malnutrition Screening Tool Score: 0    Physical Exam Vitals and nursing note reviewed. Exam conducted with a chaperone present.  Constitutional:      General: She is not in acute distress.    Appearance: Normal appearance. She is not ill-appearing.  Cardiovascular:     Rate and Rhythm: Normal rate.  Pulmonary:     Effort: Pulmonary effort is normal.  Musculoskeletal:        General: Normal range of motion.     Cervical back: Normal range of motion.  Skin:    General: Skin is warm and dry.  Neurological:     Mental Status: She is  alert and oriented to person, place, and time.  Psychiatric:        Attention and Perception: Attention and perception normal. She does not perceive auditory or visual hallucinations.        Mood and Affect: Mood is anxious and depressed. Affect is tearful.        Speech: Speech normal.        Behavior: Behavior normal. Behavior is cooperative.        Thought Content: Thought content is not paranoid. Thought content includes suicidal ideation. Thought content does not include homicidal ideation. Thought content includes suicidal plan.        Cognition and Memory: Cognition normal.        Judgment: Judgment is impulsive.    Review of Systems  Constitutional: Negative.   HENT: Negative.    Eyes: Negative.   Respiratory: Negative.    Cardiovascular: Negative.   Gastrointestinal: Negative.   Genitourinary: Negative.   Musculoskeletal: Negative.   Skin: Negative.   Neurological: Negative.   Endo/Heme/Allergies: Negative.   Psychiatric/Behavioral:  Positive for depression and suicidal ideas. Negative for hallucinations. The patient is nervous/anxious. The patient does not have insomnia.     Blood pressure 107/78, pulse 79, temperature 98.2 F (36.8 C), temperature source Oral, resp. rate 18, SpO2 100 %. There is no height or weight on file to calculate BMI.  Past Psychiatric History: Reports history of bipolar disorder and depression, one prior suicide attempt   Is  the patient at risk to self? Yes  Has the patient been a risk to self in the past 6 months? No .    Has the patient been a risk to self within the distant past? Yes   Is the patient a risk to others? No   Has the patient been a risk to others in the past 6 months? No   Has the patient been a risk to others within the distant past? No   Past Medical History:  Past Medical History:  Diagnosis Date   Anxiety    Bipolar 1 disorder (HCC)    Depression     Past Surgical History:  Procedure Laterality Date   WISDOM TOOTH  EXTRACTION      Family History: History reviewed. No pertinent family history.  Social History:  Social History   Socioeconomic History   Marital status: Single    Spouse name: Not on file   Number of children: Not on file   Years of education: Not on file   Highest education level: Not on file  Occupational History   Not on file  Tobacco Use   Smoking status: Never   Smokeless tobacco: Never  Vaping Use   Vaping Use: Every day  Substance and Sexual Activity   Alcohol use: Yes    Comment: occ   Drug use: Never   Sexual activity: Not on file  Other Topics Concern   Not on file  Social History Narrative   Not on file   Social Determinants of Health   Financial Resource Strain: Not on file  Food Insecurity: Not on file  Transportation Needs: Not on file  Physical Activity: Not on file  Stress: Not on file  Social Connections: Not on file  Intimate Partner Violence: Not on file    SDOH:  SDOH Screenings   Alcohol Screen: Not on file  Depression (PHQ2-9): Medium Risk (10/18/2021)   Depression (PHQ2-9)    PHQ-2 Score: 12  Financial Resource Strain: Not on file  Food Insecurity: Not on file  Housing: Not on file  Physical Activity: Not on file  Social Connections: Not on file  Stress: Not on file  Tobacco Use: Low Risk  (10/18/2021)   Patient History    Smoking Tobacco Use: Never    Smokeless Tobacco Use: Never    Passive Exposure: Not on file  Transportation Needs: Not on file    Last Labs:  Admission on 08/24/2021, Discharged on 08/24/2021  Component Date Value Ref Range Status   Color, Urine 08/24/2021 YELLOW  YELLOW Final   APPearance 08/24/2021 CLEAR  CLEAR Final   Specific Gravity, Urine 08/24/2021 1.025  1.005 - 1.030 Final   pH 08/24/2021 6.5  5.0 - 8.0 Final   Glucose, UA 08/24/2021 NEGATIVE  NEGATIVE mg/dL Final   Hgb urine dipstick 08/24/2021 NEGATIVE  NEGATIVE Final   Bilirubin Urine 08/24/2021 NEGATIVE  NEGATIVE Final   Ketones, ur 08/24/2021  NEGATIVE  NEGATIVE mg/dL Final   Protein, ur 16/10/3708 NEGATIVE  NEGATIVE mg/dL Final   Nitrite 62/69/4854 NEGATIVE  NEGATIVE Final   Leukocytes,Ua 08/24/2021 TRACE (A)  NEGATIVE Final   Performed at [facility], [address] [place], [place] [address]   Preg Test, Ur 08/24/2021 NEGATIVE  NEGATIVE Final   Comment:        THE SENSITIVITY OF THIS METHODOLOGY IS >20 mIU/mL. Performed at [facility], [address], [place], [place] [address]    Sodium 08/24/2021  137  135 - 145 mmol/L Final   Potassium 08/24/2021 3.8  3.5 - 5.1 mmol/L Final   Chloride 08/24/2021 102  98 - 111 mmol/L Final   CO2 08/24/2021 28  22 - 32 mmol/L Final   Glucose, Bld 08/24/2021 106 (H)  70 - 99 mg/dL Final   Glucose reference range applies only to samples taken after fasting for at least 8 hours.   BUN 08/24/2021 17  6 - 20 mg/dL Final   Creatinine, Ser 08/24/2021 0.54  0.44 - 1.00 mg/dL Final   Calcium 52/84/1324 9.3  8.9 - 10.3 mg/dL Final   GFR, Estimated 08/24/2021 >60  >60 mL/min Final   Comment: (NOTE) Calculated using the CKD-EPI Creatinine Equation (2021)    Anion gap 08/24/2021 7  5 - 15 Final   Performed at [facility], [address] [place], [place] [address]   WBC 08/24/2021 5.9  4.0 - 10.5 K/uL Final   RBC 08/24/2021 3.76 (L)  3.87 - 5.11 MIL/uL Final   Hemoglobin 08/24/2021 12.2  12.0 - 15.0 g/dL Final   HCT 72/53/6644 36.3  36.0 - 46.0 % Final   MCV 08/24/2021 96.5  80.0 - 100.0 fL Final   MCH 08/24/2021 32.4  26.0 - 34.0 pg Final   MCHC 08/24/2021 33.6  30.0 - 36.0 g/dL Final   RDW 03/47/4259 12.8  11.5 - 15.5 % Final   Platelets 08/24/2021 203  150 - 400 K/uL Final   nRBC 08/24/2021 0.0  0.0 - 0.2 % Final   Neutrophils Relative % 08/24/2021 49  % Final   Neutro Abs 08/24/2021 2.9  1.7 - 7.7 K/uL Final   Lymphocytes Relative 08/24/2021 40  % Final   Lymphs Abs 08/24/2021 2.4  0.7 - 4.0 K/uL Final   Monocytes Relative 08/24/2021 7  % Final    Monocytes Absolute 08/24/2021 0.4  0.1 - 1.0 K/uL Final   Eosinophils Relative 08/24/2021 3  % Final   Eosinophils Absolute 08/24/2021 0.2  0.0 - 0.5 K/uL Final   Basophils Relative 08/24/2021 1  % Final   Basophils Absolute 08/24/2021 0.0  0.0 - 0.1 K/uL Final   Immature Granulocytes 08/24/2021 0  % Final   Abs Immature Granulocytes 08/24/2021 0.01  0.00 - 0.07 K/uL Final   Performed at [facility], [address] [place], [place] [address]   RBC / HPF 08/24/2021 NONE SEEN  0 - 5 RBC/hpf Final   WBC, UA 08/24/2021 0-5  0 - 5 WBC/hpf Final   Bacteria, UA 08/24/2021 FEW (A)  NONE SEEN Final   Squamous Epithelial / LPF 08/24/2021 0-5  0 - 5 Final   Performed at [facility], [address] [place], [place] [address]   Yeast Wet Prep HPF POC 08/24/2021 NONE SEEN  NONE SEEN Final   Trich, Wet Prep 08/24/2021 NONE SEEN  NONE SEEN Final   Clue Cells Wet Prep HPF POC 08/24/2021 NONE SEEN  NONE SEEN Final   WBC, Wet Prep HPF POC 08/24/2021 >=10 (A)  <10 Final   Sperm 08/24/2021 NONE SEEN   Final   Performed at [facility], [address] [place], [place] [address]   Chlamydia 08/24/2021 Positive (A)   Final   Neisseria Gonorrhea 08/24/2021 Negative   Final   Comment 08/24/2021 Normal Reference Ranger Chlamydia - Negative   Final   Comment 08/24/2021 Normal Reference Range Neisseria Gonorrhea - Negative   Final  HIV-1 P24 Antigen - HIV24 08/24/2021 NON REACTIVE  NON REACTIVE Final   Comment: (NOTE) Detection of p24 may be inhibited by biotin in the sample, causing false negative results in acute infection.    HIV 1/2 Antibodies 08/24/2021 NON REACTIVE  NON REACTIVE Final   Interpretation (HIV Ag Ab) 08/24/2021 A non reactive test result means that HIV 1 or HIV 2 antibodies and HIV 1 p24 antigen were not detected in the specimen.   Final   Performed at [facility], [address] [place], [place] [address]    Allergies: Apple juice, Kiwi  extract, Strawberry extract, and Lamotrigine  PTA Medications: (Not in a hospital admission)   Medical Decision Making  Donnice Nielsen was admitted to [facility] continuous assessment unit for MDD (major depressive disorder), recurrent severe, without psychosis (HCC), crisis management, and stabilization. Routine labs ordered, which include Lab Orders         Resp Panel by RT-PCR (Flu A&B, Covid) Anterior Nasal Swab         CBC with Differential/Platelet         Comprehensive metabolic panel         Hemoglobin A1c         Magnesium         Ethanol         Lipid panel         TSH         Urinalysis, Routine w reflex microscopic Urine, Clean Catch         Pregnancy, urine         POCT Urine Drug Screen - (I-Screen)    Medication Management: Medications started Meds ordered this encounter  Medications   acetaminophen (TYLENOL) tablet 650 mg   alum & mag hydroxide-simeth (MAALOX/MYLANTA) 200-200-20 MG/5ML suspension 30 mL   magnesium hydroxide (MILK OF MAGNESIA) suspension 30 mL   hydrOXYzine (ATARAX) tablet 25 mg   traZODone (DESYREL) tablet 50 mg    Will maintain continuous observation for safety. Recommended for inpatient psychiatric treatment.  If no available beds at [facility] patient is to be faxed out.  Secure message sent to social work/TOC and [facility]) Nehemiah Settle, RN    Recommendations  Based on my evaluation the patient does not appear to have an emergency medical condition.  Deral Schellenberg, NP 10/18/21  6:13 PM

## 2021-10-18 NOTE — ED Notes (Signed)
Safe Transport Requested. 

## 2021-10-18 NOTE — ED Notes (Signed)
Pt A& O x 4, tearful and sad, SI , contracts for safety.  No distress noted. Cooperative.  Monitoring for safety.

## 2021-10-18 NOTE — ED Notes (Signed)
Pt admitted to [facility] Assessment unit for suicide attempt. Patient reports depression, and states she took overdose because she was intending to die. She states '' I'm being evicted out of my apartment and have to move back home and I had just had an argument with my dad. So yeah, I took the pills. '' Patient is tearful, sad and anxious on approach. Pt skin searched and no contraband found. Skin has cut (none open to medial right and left ankle. No open wounds noted. Pt is cooperative,oriented to the unit. Medication given per pt request for anxiety and food and fluids offered. Pt is safe, will con' t to monitor.

## 2021-10-18 NOTE — ED Notes (Signed)
Pt was very tearful at the start of shift.

## 2021-10-18 NOTE — ED Notes (Signed)
Report called to RN Kennith Gain, [facility] rm [id number].  Pending Safe Transport at 11.30pm.

## 2021-10-18 NOTE — Progress Notes (Signed)
Pt was accepted to [facility] 10/18/21; Bed Assignment 404-1   Pt meets inpatient criteria per Sindy Guadeloupe, NP  Attending Physician will be Dr. Phineas Inches  x MDD (major depressive disorder), recurrent severe, without psychosis (HCC). Suicidal ideation.  Report can be called to: - Child and Adolescence unit: [phone] -Adult unit: [phone]  Per [facility] Please pre admit this patient. fax vol to [phone] and inform.  Pt can arrive after this shift (evening)   Care Team Notified: [facility] Fransico Michael, RN, Etta Grandchild [place], RN, Hansel Starling, RN, Gabriel Earing, RN, Marja Kays, RN, Jacques Navy, RN, and Assunta Found, NP.   Kelton Pillar, LCSWA 10/18/2021 @ 10:37 PM

## 2021-10-19 ENCOUNTER — Inpatient Hospital Stay (HOSPITAL_COMMUNITY)
Admission: AD | Admit: 2021-10-19 | Discharge: 2021-10-22 | DRG: 885 | Disposition: A | Discharge status: Home / Self Care | Payer: Medicaid Other | Source: Intra-hospital | Attending: Psychiatry | Admitting: Psychiatry

## 2021-10-19 ENCOUNTER — Encounter (HOSPITAL_COMMUNITY): Payer: Self-pay | Admitting: Psychiatry

## 2021-10-19 DIAGNOSIS — Z9151 Personal history of suicidal behavior: Secondary | ICD-10-CM | POA: Diagnosis not present

## 2021-10-19 DIAGNOSIS — N739 Female pelvic inflammatory disease, unspecified: Secondary | ICD-10-CM | POA: Diagnosis present

## 2021-10-19 DIAGNOSIS — Z79899 Other long term (current) drug therapy: Secondary | ICD-10-CM

## 2021-10-19 DIAGNOSIS — Z888 Allergy status to other drugs, medicaments and biological substances status: Secondary | ICD-10-CM

## 2021-10-19 DIAGNOSIS — F332 Major depressive disorder, recurrent severe without psychotic features: Secondary | ICD-10-CM | POA: Diagnosis present

## 2021-10-19 DIAGNOSIS — B3749 Other urogenital candidiasis: Secondary | ICD-10-CM | POA: Diagnosis present

## 2021-10-19 DIAGNOSIS — F411 Generalized anxiety disorder: Secondary | ICD-10-CM | POA: Diagnosis present

## 2021-10-19 DIAGNOSIS — E7800 Pure hypercholesterolemia, unspecified: Secondary | ICD-10-CM | POA: Diagnosis present

## 2021-10-19 DIAGNOSIS — Z91199 Patient's noncompliance with other medical treatment and regimen due to unspecified reason: Secondary | ICD-10-CM

## 2021-10-19 DIAGNOSIS — Z818 Family history of other mental and behavioral disorders: Secondary | ICD-10-CM

## 2021-10-19 DIAGNOSIS — G4700 Insomnia, unspecified: Secondary | ICD-10-CM | POA: Diagnosis present

## 2021-10-19 DIAGNOSIS — F603 Borderline personality disorder: Secondary | ICD-10-CM | POA: Diagnosis present

## 2021-10-19 DIAGNOSIS — Z91018 Allergy to other foods: Secondary | ICD-10-CM

## 2021-10-19 DIAGNOSIS — Z793 Long term (current) use of hormonal contraceptives: Secondary | ICD-10-CM | POA: Diagnosis not present

## 2021-10-19 HISTORY — DX: Other specified health status: Z78.9

## 2021-10-19 MED ORDER — DOXYCYCLINE HYCLATE 100 MG PO TABS
100.0000 mg | ORAL_TABLET | Freq: Two times a day (BID) | ORAL | Status: DC
  Filled 2021-10-19: qty 1

## 2021-10-19 MED ORDER — LEVONORGESTREL-ETHINYL ESTRAD 0.1-20 MG-MCG PO TABS: 1.0000 | ORAL_TABLET | Freq: Every day | ORAL | Status: DC

## 2021-10-19 MED ORDER — SERTRALINE HCL 25 MG PO TABS
25.0000 mg | ORAL_TABLET | Freq: Every day | ORAL | Status: DC
  Administered 2021-10-19 – 2021-10-20 (×2): 25 mg via ORAL
  Filled 2021-10-19 (×3): qty 1

## 2021-10-19 MED ORDER — TRAZODONE HCL 50 MG PO TABS
50.0000 mg | ORAL_TABLET | Freq: Every evening | ORAL | Status: DC | PRN
  Administered 2021-10-19 – 2021-10-21 (×4): 50 mg via ORAL
  Filled 2021-10-19 (×4): qty 1

## 2021-10-19 MED ORDER — DOXYCYCLINE HYCLATE 100 MG PO TABS
100.0000 mg | ORAL_TABLET | Freq: Two times a day (BID) | ORAL | Status: DC
  Administered 2021-10-19 – 2021-10-22 (×7): 100 mg via ORAL
  Filled 2021-10-19 (×9): qty 1

## 2021-10-19 MED ORDER — ALUM & MAG HYDROXIDE-SIMETH 200-200-20 MG/5ML PO SUSP: 30.0000 mL | ORAL | Status: DC | PRN

## 2021-10-19 MED ORDER — MAGNESIUM HYDROXIDE 400 MG/5ML PO SUSP: 30.0000 mL | Freq: Every day | ORAL | Status: DC | PRN

## 2021-10-19 MED ORDER — LEVONORGESTREL-ETHINYL ESTRAD 0.15-30 MG-MCG PO TABS: 1.0000 | ORAL_TABLET | Freq: Every day | ORAL | Status: DC

## 2021-10-19 MED ORDER — PAROXETINE HCL 10 MG PO TABS
10.0000 mg | ORAL_TABLET | Freq: Every day | ORAL | Status: DC
  Filled 2021-10-19: qty 1

## 2021-10-19 MED ORDER — ACETAMINOPHEN 325 MG PO TABS
650.0000 mg | ORAL_TABLET | Freq: Four times a day (QID) | ORAL | Status: DC | PRN
  Administered 2021-10-19 – 2021-10-22 (×4): 650 mg via ORAL
  Filled 2021-10-19 (×4): qty 2

## 2021-10-19 MED ORDER — LEVONORGESTREL-ETHINYL ESTRAD 0.1-20 MG-MCG PO TABS: 2.0000 | ORAL_TABLET | Freq: Once | ORAL | Status: DC

## 2021-10-19 NOTE — Progress Notes (Signed)
Patient alert and oriented x4. Patient has been pleasant this shift. Pt denies any current SI/HI, denies AVH. Pt denies any pain at present. Started Zoloft 25mg  today, education provided to patient regarding new medication. Denies any anxiety or depression at present. Will continue q70min checks and provide verbal encouragement as needed.

## 2021-10-19 NOTE — H&P (Signed)
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 Goals: Ability to demonstrate self-control will improve, Ability to identify and develop effective coping behaviors will improve, and Ability to maintain clinical measurements within normal limits will improve  -- Long Term Goals: Improvement in symptoms so as ready for  discharge    3. Medical Issues Being Addressed:   -Continue Doxycycline 100 mg twice daily for pelvic inflammatory disease.  4. Discharge Planning:   -- Social work and case management to assist with discharge planning and identification of hospital follow-up needs prior to discharge  -- Estimated LOS: 5-7 days  -- Discharge Concerns: Need to establish a safety plan; Medication compliance and effectiveness  -- Discharge Goals: Return home with outpatient referrals for mental health follow-up including medication management/psychotherapy    I certify that inpatient services furnished can reasonably be expected to improve the patient's condition.    Lorri Frederick, MD 7/6/202311:48 AM

## 2021-10-19 NOTE — Progress Notes (Addendum)
   10/19/21 2010  Psych Admission Type (Psych Patients Only)  Admission Status Voluntary  Psychosocial Assessment  Patient Complaints None  Eye Contact Fair  Facial Expression Anxious  Affect Sad  Speech Logical/coherent  Interaction Assertive  Motor Activity Other (Comment) (wnl)  Appearance/Hygiene Unremarkable  Behavior Characteristics Cooperative;Appropriate to situation  Mood Depressed  Thought Process  Coherency WDL  Content WDL  Delusions None reported or observed  Perception WDL  Hallucination None reported or observed  Judgment Limited  Confusion None  Danger to Self  Current suicidal ideation? Denies  Danger to Others  Danger to Others None reported or observed   Progress note   D: Pt seen in dayroom. Pt denies SI, HI, AVH. Pt rates pain  6/10 as abdominal pain d/t PID. Pt rates anxiety  0/10 and depression  0/10. Pt states that she has been attending group and her appetite is good. Pt states sleep is fair. Pt states one good thing about her day is that she got to see her mom, who visited today. No other complaints noted at this time.  A: Pt provided support and encouragement. Pt given scheduled medication as prescribed. Birth control pills not available as home supply was not in her purse but is locked in her apartment. PRNs as appropriate. Q15 min checks for safety.   R: Pt safe on the unit. Will continue to monitor.
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
## 2021-10-19 NOTE — Tx Team (Signed)
Initial Treatment Plan 10/19/2021 2:02 AM Elenore Paddy Gilda Abboud [id number]    PATIENT STRESSORS: Other: emotional disregularity     PATIENT STRENGTHS: Ability for insight  Average or above average intelligence  Capable of independent living  Communication skills  General fund of knowledge  Physical Health    PATIENT IDENTIFIED PROBLEMS: Emotionally labile  Self harm behaviors 3 weeks ago  SA by ingestion of 6 800 mg motrin                 DISCHARGE CRITERIA:  Improved stabilization in mood, thinking, and/or behavior  PRELIMINARY DISCHARGE PLAN: Attend aftercare/continuing care group  PATIENT/FAMILY INVOLVEMENT: This treatment plan has been presented to and reviewed with the patient, Chatara Lucente, and/or family member.  The patient and family have been given the opportunity to ask questions and make suggestions.  [place] Talisa Petrak, RN 10/19/2021, 2:02 AM

## 2021-10-19 NOTE — Progress Notes (Signed)
[facility] Group Notes:  (Nursing/MHT/Case Management/Adjunct)  Date:  10/19/2021  Time:  2015  Type of Therapy:   wrap up group  Participation Level:  Active  Participation Quality:  Appropriate, Attentive, Sharing, and Supportive  Affect:  Depressed  Cognitive:  Alert  Insight:  Good  Engagement in Group:  Engaged  Modes of Intervention:  Clarification, Education, and Support  Summary of Progress/Problems: Positive thinking and positive change were discussed.   Johann Capers S 10/19/2021, 9:03 PM

## 2021-10-19 NOTE — Progress Notes (Signed)
Pt received A & O x 4 after being med cleared s/p ingestion of 6 tabs 800 mg motrin in [place]. Pt now denies SI,HI, A/VH. Pt endorses emotional dysregulation and frequent panic attacks and crying spells. Pt endorses that she has been having some medication changes and not feeling like herself. Pt contracts for safety and is asking for help. Pt calm and cooperative throughout assessment process. Pt denies pain and requested medication for sleep. Pt oriented to staff and unit. Pt informed about smoking policy and offered food. Will continue to assess.
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
## 2021-10-20 ENCOUNTER — Encounter (HOSPITAL_COMMUNITY): Payer: Self-pay

## 2021-10-20 MED ORDER — SERTRALINE HCL 50 MG PO TABS
50.0000 mg | ORAL_TABLET | Freq: Every day | ORAL | Status: DC
  Administered 2021-10-21 – 2021-10-22 (×2): 50 mg via ORAL
  Filled 2021-10-20 (×3): qty 1

## 2021-10-20 MED ORDER — CLOTRIMAZOLE 1 % VA CREA
1.0000 | TOPICAL_CREAM | Freq: Every day | VAGINAL | Status: DC
  Administered 2021-10-20 – 2021-10-21 (×2): 1 via VAGINAL
  Filled 2021-10-20: qty 45

## 2021-10-20 NOTE — Group Note (Signed)
LCSW Group Therapy Note   Group Date: 10/20/2021 Start Time: 1300 End Time: 1400   Type of Therapy and Topic:  Group Therapy: Boundaries  Participation Level:  Active  Description of Group: This group will address the use of boundaries in their personal lives. Patients will explore why boundaries are important, the difference between healthy and unhealthy boundaries, and negative and postive outcomes of different boundaries and will look at how boundaries can be crossed.  Patients will be encouraged to identify current boundaries in their own lives and identify what kind of boundary is being set. Facilitators will guide patients in utilizing problem-solving interventions to address and correct types boundaries being used and to address when no boundary is being used. Understanding and applying boundaries will be explored and addressed for obtaining and maintaining a balanced life. Patients will be encouraged to explore ways to assertively make their boundaries and needs known to significant others in their lives, using other group members and facilitator for role play, support, and feedback.  Therapeutic Goals:  1.  Patient will identify areas in their life where setting clear boundaries could be  used to improve their life.  2.  Patient will identify signs/triggers that a boundary is not being respected. 3.  Patient will identify two ways to set boundaries in order to achieve balance in  their lives: 4.  Patient will demonstrate ability to communicate their needs and set boundaries  through discussion and/or role plays  Summary of Patient Progress:  The Pt was present/active throughout the session and proved open to feedback from CSW and peers. Patient demonstrated insight into the subject matter, was respectful of peers, and was present throughout the entire session.  Therapeutic Modalities:   Cognitive Behavioral Therapy Solution-Focused Therapy  Yeriel Mineo M Anye Brose, LCSWA 10/20/2021  2:01 PM     

## 2021-10-20 NOTE — Progress Notes (Signed)
Patient attend wrap up group AA. 

## 2021-10-20 NOTE — Group Note (Signed)
Recreation Therapy Group Note   Group Topic:Team Building  Group Date: 10/20/2021 Start Time: 0930 End Time: 1000 Facilitators: Caroll Rancher, LRT,CTRS Location: [address] Dayroom   Goal Area(s) Addresses:  Patient will effectively work with peer towards shared goal.  Patient will identify skill used to make activity successful.  Patient will identify how skills used during activity can be used to reach post d/c goals.   Group Description:  Patient(s) were given a set of solo cups, a rubber band, and some tied strings. The objective is to build a pyramid with the cups by only using the rubber band and string to move the cups. After the activity the patient(s) and LRT debriefed and discussed what strategies worked, what didn't, and what lessons they can take from the activity and use in life post discharge.    Affect/Mood: Appropriate   Participation Level: Engaged   Participation Quality: Independent and Maximum Cues   Behavior: Appropriate   Speech/Thought Process: Focused   Insight: Good   Judgement: Good   Modes of Intervention: Team-building   Patient Response to Interventions:  Engaged   Education Outcome:  Acknowledges education and In group clarification offered    Clinical Observations/Individualized Feedback: Pt actively participated in group.    Plan: Continue to engage patient in RT group sessions 2-3x/week.   Caroll Rancher, LRT,CTRS 10/20/2021 12:04 PM

## 2021-10-20 NOTE — Progress Notes (Signed)
[facility] MD Progress Note  10/20/2021 3:41 PM Sylvia Gray  MRN:  [id number] Subjective:    Patient is a 21 year old female with a past psychiatric history of major depressive disorder and bipolar type 1 disorder (patient does not appear to meet criteria), who was admitted voluntarily on 10/19/2021 to the [facility] after attempting suicide by ingesting six, 800 mg ibuprofen tablets.   Yesterday, the following recommendations were made:  -Start Zoloft 25 mg - For anxiety and MDD  Interval History: PRN Medications administered within the last 24 hours: tylenol x2, trazodon x1 Per nursing staff: logical and coherent with a depressed affect   Per Patient:  On assessment today, the patient reports that her mood is "good".  She reports good sleep and appetite.  Her diagnosis of borderline personality disorder is discussed.  She reports "I was diagnosed with bipolar disorder, but I did not think that was right".  When asked what having borderline personality disorder means to her she states "I need an extra step more than other people".  Discussed the diagnosis and its implications with the patient.  She reflects "with every minor inconvenience I want to kill myself".  She appears to demonstrate more insight than the previous day.  She denies suicidal thoughts and states these last occurred on admission.  The patient signed her 72-hour form yesterday.  She is adamant that she must return to work on Monday are all she will lose her job, 1 which she says she enjoys.  She denies homicidal thoughts or auditory/visual hallucinations.  Patient denied side effects to current scheduled psychiatric medications.   Patient reported other somatic complaints, including: a yeast infection, amenable to Monistat.    Principal Problem: MDD (major depressive disorder), recurrent severe, without psychosis (HCC) Diagnosis: Principal Problem:   MDD (major depressive disorder), recurrent  severe, without psychosis (HCC) Active Problems:   Borderline personality disorder (HCC)   Anxiety state  Total Time spent with patient: 20 minutes  Past Psychiatric History: Past Psychiatric Hx: Previous Psych Diagnoses: -Major Depressive Disorder  -Bipolar Disorder Type 1   Prior inpatient treatment: - In 2018, [facility] (3 days) and transfer to [facility] due to suicide attempt with overdose of pills. Current/prior outpatient treatment: Patient is unable to specify and has unclear/inconsistent outpatient psychiatric care.   Prior rehab hx: Denies Psychotherapy AT:[facility]   History of suicide: - Age 54 (2018), patient refers suicide attempt with pills. - Age [age] , patient refers suicide attempt by cutting neck with scissors. History of homicide: Denies  Past Medical History:  Past Medical History:  Diagnosis Date   Anxiety    Bipolar 1 disorder (HCC)    Depression    Medical history non-contributory     Past Surgical History:  Procedure Laterality Date   NO PAST SURGERIES     WISDOM TOOTH EXTRACTION     Family History: History reviewed. No pertinent family history. Family Psychiatric  History:  Psych:  -Mother: Major depressive disorder. Psych Rx: denies SA/HA: Substance use family hx: denies Social History:  Social History   Substance and Sexual Activity  Alcohol Use Yes   Comment: occ     Social History   Substance and Sexual Activity  Drug Use Never    Social History   Socioeconomic History   Marital status: Single    Spouse name: Not on file   Number of children: Not on file   Years of education: Not on  file   Highest education level: Not on file  Occupational History   Not on file  Tobacco Use   Smoking status: Never   Smokeless tobacco: Never  Vaping Use   Vaping Use: Every day  Substance and Sexual Activity   Alcohol use: Yes    Comment: occ   Drug use: Never   Sexual activity: Yes    Birth  control/protection: Pill  Other Topics Concern   Not on file  Social History Narrative   Not on file   Social Determinants of Health   Financial Resource Strain: Not on file  Food Insecurity: Not on file  Transportation Needs: Not on file  Physical Activity: Not on file  Stress: Not on file  Social Connections: Not on file   Additional Social History:    Sleep: Fair  Appetite:  Fair  Current Medications: Current Facility-Administered Medications  Medication Dose Route Frequency Provider Last Rate Last Admin   acetaminophen (TYLENOL) tablet 650 mg  650 mg Oral Q6H PRN Onuoha, Chinwendu V, NP   650 mg at 10/19/21 2134   alum & mag hydroxide-simeth (MAALOX/MYLANTA) 200-200-20 MG/5ML suspension 30 mL  30 mL Oral Q4H PRN Onuoha, Chinwendu V, NP       clotrimazole (GYNE-LOTRIMIN) vaginal cream 1 Applicatorful  1 Applicatorful Vaginal QHS Carlyn Reichert, MD       doxycycline (VIBRA-TABS) tablet 100 mg  100 mg Oral Q12H Onuoha, Chinwendu V, NP   100 mg at 10/20/21 0808   levonorgestrel-ethinyl estradiol (ALESSE) 0.1-20 MG-MCG per tablet 2 tablet  2 tablet Oral Once Massengill, Harrold Donath, MD       Followed by   levonorgestrel-ethinyl estradiol (ALESSE) 0.1-20 MG-MCG per tablet 1 tablet  1 tablet Oral QAC supper Massengill, Harrold Donath, MD       magnesium hydroxide (MILK OF MAGNESIA) suspension 30 mL  30 mL Oral Daily PRN Onuoha, Chinwendu V, NP       [START ON 10/21/2021] sertraline (ZOLOFT) tablet 50 mg  50 mg Oral Daily Carlyn Reichert, MD       traZODone (DESYREL) tablet 50 mg  50 mg Oral QHS PRN Onuoha, Chinwendu V, NP   50 mg at 10/19/21 2134    Lab Results:  Results for orders placed or performed during the hospital encounter of 10/18/21 (from the past 48 hour(s))  Ethanol     Status: None   Collection Time: 10/18/21  5:03 PM  Result Value Ref Range   Alcohol, Ethyl (B) <10 <10 mg/dL    Comment: (NOTE) Lowest detectable limit for serum alcohol is 10 mg/dL.  For medical purposes  only. Performed at [facility], [address] [place], [place] [address]   CBC with Differential/Platelet     Status: None   Collection Time: 10/18/21  5:50 PM  Result Value Ref Range   WBC 5.2 4.0 - 10.5 K/uL   RBC 4.24 3.87 - 5.11 MIL/uL   Hemoglobin 13.3 12.0 - 15.0 g/dL   HCT 40.0 86.7 - 61.9 %   MCV 96.5 80.0 - 100.0 fL   MCH 31.4 26.0 - 34.0 pg   MCHC 32.5 30.0 - 36.0 g/dL   RDW 50.9 32.6 - 71.2 %   Platelets 235 150 - 400 K/uL   nRBC 0.0 0.0 - 0.2 %   Neutrophils Relative % 52 %   Neutro Abs 2.7 1.7 - 7.7 K/uL   Lymphocytes Relative 39 %   Lymphs Abs 2.0 0.7 - 4.0 K/uL   Monocytes Relative  7 %   Monocytes Absolute 0.4 0.1 - 1.0 K/uL   Eosinophils Relative 1 %   Eosinophils Absolute 0.0 0.0 - 0.5 K/uL   Basophils Relative 1 %   Basophils Absolute 0.0 0.0 - 0.1 K/uL   Immature Granulocytes 0 %   Abs Immature Granulocytes 0.01 0.00 - 0.07 K/uL    Comment: Performed at [facility], [address] [place], [place] [address]  Comprehensive metabolic panel     Status: Abnormal   Collection Time: 10/18/21  5:50 PM  Result Value Ref Range   Sodium 139 135 - 145 mmol/L   Potassium 3.8 3.5 - 5.1 mmol/L   Chloride 104 98 - 111 mmol/L   CO2 25 22 - 32 mmol/L   Glucose, Bld 104 (H) 70 - 99 mg/dL    Comment: Glucose reference range applies only to samples taken after fasting for at least 8 hours.   BUN 12 6 - 20 mg/dL   Creatinine, Ser 6.04 0.44 - 1.00 mg/dL   Calcium 9.5 8.9 - 54.0 mg/dL   Total Protein 7.6 6.5 - 8.1 g/dL   Albumin 4.5 3.5 - 5.0 g/dL   AST 18 15 - 41 U/L   ALT 14 0 - 44 U/L   Alkaline Phosphatase 53 38 - 126 U/L   Total Bilirubin 0.8 0.3 - 1.2 mg/dL   GFR, Estimated >98 >11 mL/min    Comment: (NOTE) Calculated using the CKD-EPI Creatinine Equation (2021)    Anion gap 10 5 - 15    Comment: Performed at [facility], [address], [place], [place] [address]  Magnesium     Status: None   Collection Time: 10/18/21  5:50 PM  Result  Value Ref Range   Magnesium 2.2 1.7 - 2.4 mg/dL    Comment: Performed at [facility], [address], [place], [place] [address]  Resp Panel by RT-PCR (Flu A&B, Covid) Anterior Nasal Swab     Status: None   Collection Time: 10/18/21  6:10 PM   Specimen: Anterior Nasal Swab  Result Value Ref Range   SARS Coronavirus 2 by RT PCR NEGATIVE NEGATIVE    Comment: (NOTE) SARS-CoV-2 target nucleic acids are NOT DETECTED.  The SARS-CoV-2 RNA is generally detectable in upper respiratory specimens during the acute phase of infection. The lowest concentration of SARS-CoV-2 viral copies this assay can detect is 138 copies/mL. A negative result does not preclude SARS-Cov-2 infection and should not be used as the sole basis for treatment or other patient management decisions. A negative result may occur with  improper specimen collection/handling, submission of specimen other than nasopharyngeal swab, presence of viral mutation(s) within the areas targeted by this assay, and inadequate number of viral copies(<138 copies/mL). A negative result must be combined with clinical observations, patient history, and epidemiological information. The expected result is Negative.  Fact Sheet for Patients:  BloggerCourse.com  Fact Sheet for Healthcare Providers:  SeriousBroker.it  This test is no t yet approved or cleared by the [place] FDA and  has been authorized for detection and/or diagnosis of SARS-CoV-2 by FDA under an Emergency Use Authorization (EUA). This EUA will remain  in effect (meaning this test can be used) for the duration of the COVID-19 declaration under Section 564(b)(1) of the Act, 21 U.S.C.section 360bbb-3(b)(1), unless the authorization is terminated  or revoked sooner.       Influenza A by PCR NEGATIVE NEGATIVE   Influenza B by PCR NEGATIVE NEGATIVE  Comment: (NOTE) The Xpert Xpress SARS-CoV-2/FLU/RSV plus assay is  intended as an aid in the diagnosis of influenza from Nasopharyngeal swab specimens and should not be used as a sole basis for treatment. Nasal washings and aspirates are unacceptable for Xpert Xpress SARS-CoV-2/FLU/RSV testing.  Fact Sheet for Patients: BloggerCourse.com  Fact Sheet for Healthcare Providers: SeriousBroker.it  This test is not yet approved or cleared by the [place] FDA and has been authorized for detection and/or diagnosis of SARS-CoV-2 by FDA under an Emergency Use Authorization (EUA). This EUA will remain in effect (meaning this test can be used) for the duration of the COVID-19 declaration under Section 564(b)(1) of the Act, 21 U.S.C. section 360bbb-3(b)(1), unless the authorization is terminated or revoked.  Performed at [facility], [address], [place], [place] [address]   Hemoglobin A1c     Status: Abnormal   Collection Time: 10/18/21  6:11 PM  Result Value Ref Range   Hgb A1c MFr Bld 4.6 (L) 4.8 - 5.6 %    Comment: (NOTE) Pre diabetes:          5.7%-6.4%  Diabetes:              >6.4%  Glycemic control for   <7.0% adults with diabetes    Mean Plasma Glucose 85.32 mg/dL    Comment: Performed at [facility], [address], [place], [place] [address]  Lipid panel     Status: Abnormal   Collection Time: 10/18/21  6:11 PM  Result Value Ref Range   Cholesterol 217 (H) 0 - 200 mg/dL   Triglycerides 52 <616 mg/dL   HDL 56 >07 mg/dL   Total CHOL/HDL Ratio 3.9 RATIO   VLDL 10 0 - 40 mg/dL   LDL Cholesterol 371 (H) 0 - 99 mg/dL    Comment:        Total Cholesterol/HDL:CHD Risk Coronary Heart Disease Risk Table                     Men   Women  1/2 Average Risk   3.4   3.3  Average Risk       5.0   4.4  2 X Average Risk   9.6   7.1  3 X Average Risk  23.4   11.0        Use the calculated Patient Ratio above and the CHD Risk Table to determine the patient's CHD Risk.         ATP III CLASSIFICATION (LDL):  <100     mg/dL   Optimal  062-694  mg/dL   Near or Above                    Optimal  130-159  mg/dL   Borderline  854-627  mg/dL   High  >035     mg/dL   Very High Performed at [facility], [address], [place], [place] [address]   TSH     Status: Abnormal   Collection Time: 10/18/21  6:11 PM  Result Value Ref Range   TSH 0.319 (L) 0.350 - 4.500 uIU/mL    Comment: Performed by a 3rd Generation assay with a functional sensitivity of <=0.01 uIU/mL. Performed at [facility], [address] [place], [place] [address]   Urinalysis, Routine w reflex microscopic Urine, Clean Catch     Status: Abnormal   Collection Time: 10/18/21  6:11 PM  Result Value Ref  Range   Color, Urine YELLOW YELLOW   APPearance HAZY (A) CLEAR   Specific Gravity, Urine 1.024 1.005 - 1.030   pH 5.0 5.0 - 8.0   Glucose, UA NEGATIVE NEGATIVE mg/dL   Hgb urine dipstick NEGATIVE NEGATIVE   Bilirubin Urine NEGATIVE NEGATIVE   Ketones, ur NEGATIVE NEGATIVE mg/dL   Protein, ur 161 (A) NEGATIVE mg/dL   Nitrite NEGATIVE NEGATIVE   Leukocytes,Ua NEGATIVE NEGATIVE   RBC / HPF 0-5 0 - 5 RBC/hpf   WBC, UA 0-5 0 - 5 WBC/hpf   Bacteria, UA RARE (A) NONE SEEN   Squamous Epithelial / LPF 0-5 0 - 5   Mucus PRESENT     Comment: Performed at [facility], [address] [place], [place] [address]  Pregnancy, urine     Status: None   Collection Time: 10/18/21  6:11 PM  Result Value Ref Range   Preg Test, Ur NEGATIVE NEGATIVE    Comment: Performed at [facility], [address], [place], [place] [address]  POCT Urine Drug Screen - (I-Screen)     Status: Abnormal   Collection Time: 10/18/21  6:11 PM  Result Value Ref Range   POC Amphetamine UR None Detected NONE DETECTED (Cut Off Level 1000 ng/mL)   POC Secobarbital (BAR) None Detected NONE DETECTED (Cut Off Level 300 ng/mL)   POC Buprenorphine (BUP) None Detected NONE DETECTED (Cut Off Level 10 ng/mL)   POC  Oxazepam (BZO) None Detected NONE DETECTED (Cut Off Level 300 ng/mL)   POC Cocaine UR None Detected NONE DETECTED (Cut Off Level 300 ng/mL)   POC Methamphetamine UR None Detected NONE DETECTED (Cut Off Level 1000 ng/mL)   POC Morphine None Detected NONE DETECTED (Cut Off Level 300 ng/mL)   POC Methadone UR None Detected NONE DETECTED (Cut Off Level 300 ng/mL)   POC Oxycodone UR None Detected NONE DETECTED (Cut Off Level 100 ng/mL)   POC Marijuana UR Positive (A) NONE DETECTED (Cut Off Level 50 ng/mL)  POC SARS Coronavirus 2 Ag     Status: None   Collection Time: 10/18/21  6:22 PM  Result Value Ref Range   SARSCOV2ONAVIRUS 2 AG NEGATIVE NEGATIVE    Comment: (NOTE) SARS-CoV-2 antigen NOT DETECTED.   Negative results are presumptive.  Negative results do not preclude SARS-CoV-2 infection and should not be used as the sole basis for treatment or other patient management decisions, including infection  control decisions, particularly in the presence of clinical signs and  symptoms consistent with COVID-19, or in those who have been in contact with the virus.  Negative results must be combined with clinical observations, patient history, and epidemiological information. The expected result is Negative.  Fact Sheet for Patients: https://www.jennings-kim.com/  Fact Sheet for Healthcare Providers: https://alexander-rogers.biz/  This test is not yet approved or cleared by the [place] FDA and  has been authorized for detection and/or diagnosis of SARS-CoV-2 by FDA under an Emergency Use Authorization (EUA).  This EUA will remain in effect (meaning this test can be used) for the duration of  the COV ID-19 declaration under Section 564(b)(1) of the Act, 21 U.S.C. section 360bbb-3(b)(1), unless the authorization is terminated or revoked sooner.    Pregnancy, urine POC     Status: None   Collection Time: 10/18/21  6:22 PM  Result Value Ref Range   Preg Test, Ur  NEGATIVE NEGATIVE    Comment:        THE SENSITIVITY OF THIS METHODOLOGY IS >  24 mIU/mL     Blood Alcohol level:  Lab Results  Component Value Date   ETH <10 10/18/2021   ETH <5 06/30/2016    Metabolic Disorder Labs: Lab Results  Component Value Date   HGBA1C 4.6 (L) 10/18/2021   MPG 85.32 10/18/2021   No results found for: "PROLACTIN" Lab Results  Component Value Date   CHOL 217 (H) 10/18/2021   TRIG 52 10/18/2021   HDL 56 10/18/2021   CHOLHDL 3.9 10/18/2021   VLDL 10 10/18/2021   LDLCALC 151 (H) 10/18/2021    Physical Findings: AIMS:  , ,  ,  ,    CIWA:    COWS:     Musculoskeletal: Strength & Muscle Tone: within normal limits Gait & Station: normal Patient leans: N/A  Psychiatric Specialty Exam:  Presentation  General Appearance: Appropriate for Environment; Casual  Eye Contact:Good  Speech:Clear and Coherent; Normal Rate  Speech Volume:Normal  Handedness:Right   Mood and Affect  Mood: "good" Affect: euthymic  Thought Process  Thought Processes:Coherent  Descriptions of Associations:Intact  Orientation:Full (Time, Place and Person)  Thought Content:Logical  History of Schizophrenia/Schizoaffective disorder:No  Duration of Psychotic Symptoms:No data recorded Hallucinations:Hallucinations: None  Ideas of Reference:None  Suicidal Thoughts:Suicidal Thoughts: No  Homicidal Thoughts:Homicidal Thoughts: No   Sensorium  Memory:Remote Good; Immediate Good  Judgment:Poor  Insight:Poor   Executive Functions  Concentration:Fair  Attention Span:Fair  Recall:Good  Fund of Knowledge:Fair  Language:Fair   Psychomotor Activity  Psychomotor Activity:Psychomotor Activity: Normal   Assets  Assets:Communication Skills; Desire for Improvement; Housing; Physical Health; Resilience; Social Support   Sleep  Sleep:Sleep: Good Number of Hours of Sleep: 7    Physical Exam: Physical Exam Constitutional:      Appearance: the  patient is not toxic-appearing.  Pulmonary:     Effort: Pulmonary effort is normal.  Neurological:     General: No focal deficit present.     Mental Status: the patient is alert and oriented to person, place, and time.   Review of Systems  Respiratory:  Negative for shortness of breath.   Cardiovascular:  Negative for chest pain.  Gastrointestinal:  Negative for abdominal pain, constipation, diarrhea, nausea and vomiting.  Neurological:  Negative for headaches.   Blood pressure 106/75, pulse 88, temperature 98.4 F (36.9 C), temperature source Oral, resp. rate 14, height 5\' 3"  (1.6 m), weight 48.2 kg, last menstrual period 10/10/2021, SpO2 99 %. Body mass index is 18.81 kg/m.   ASSESSMENT:   Diagnoses / Active Problems: -Major Depressive Disorder, recurrent severe, without psychosis -Borderline personality disorder, rule out bipolar disorder type 1 -Anxiety   PLAN: Safety and Monitoring:             -- Voluntary admission to inpatient psychiatric unit for safety, stabilization and treatment             -- Daily contact with patient to assess and evaluate symptoms and progress in treatment             -- Patient's case to be discussed in multi-disciplinary team meeting             -- Observation Level : q15 minute checks             -- Vital signs:  q12 hours             -- Precautions: suicide, elopement, and assault   2. Psychiatric Diagnoses and Treatment:  -Increase Zoloft to 50 mg - For anxiety and MDD -- The risks/benefits/side-effects/alternatives  to this medication were discussed in detail with the patient and time was given for questions. The patient consents to medication trial.              -- Encouraged patient to participate in unit milieu and in scheduled group therapies              -- Short Term Goals: Ability to demonstrate self-control will improve, Ability to identify and develop effective coping behaviors will improve, and Ability to maintain clinical  measurements within normal limits will improve             -- Long Term Goals: Improvement in symptoms so as ready for discharge                3. Medical Issues Being Addressed:              -Continue Doxycycline 100 mg twice daily for pelvic inflammatory disease.  -Continue Monistat for yeast infection   4. Discharge Planning:              -- Social work and case management to assist with discharge planning and identification of hospital follow-up needs prior to discharge             -- Estimated LOS: 5-7 days             -- Discharge Concerns: Need to establish a safety plan; Medication compliance and effectiveness             -- Discharge Goals: Return home with outpatient referrals for mental health follow-up including medication management/psychotherapy   Treatment Plan Summary: Daily contact with patient to assess and evaluate symptoms and progress in treatment and Medication management   Carlyn ReichertNick Anaisha Mago, MD 10/20/2021, 3:41 PM

## 2021-10-20 NOTE — Progress Notes (Signed)
   10/20/21 2140  Psych Admission Type (Psych Patients Only)  Admission Status Voluntary  Psychosocial Assessment  Patient Complaints None  Eye Contact Fair  Facial Expression Other (Comment) (smiles upon approach)  Affect Appropriate to circumstance  Speech Logical/coherent  Interaction Assertive  Motor Activity Other (Comment) (wnl)  Appearance/Hygiene Unremarkable  Behavior Characteristics Cooperative;Appropriate to situation  Mood Pleasant  Thought Process  Coherency WDL  Content WDL  Delusions None reported or observed  Perception WDL  Hallucination None reported or observed  Judgment Poor  Confusion None  Danger to Self  Current suicidal ideation? Denies

## 2021-10-20 NOTE — BH IP Treatment Plan (Signed)
Interdisciplinary Treatment and Diagnostic Plan Update  10/20/2021 Time of Session: 10:35am Sylvia Gray MRN: [id number]  Principal Diagnosis: MDD (major depressive disorder), recurrent severe, without psychosis ([place])  Secondary Diagnoses: Principal Problem:   MDD (major depressive disorder), recurrent severe, without psychosis ([place]) Active Problems:   Borderline personality disorder ([place])   Anxiety state   Current Medications:  Current Facility-Administered Medications  Medication Dose Route Frequency Provider Last Rate Last Admin   acetaminophen (TYLENOL) tablet 650 mg  650 mg Oral Q6H PRN Onuoha, Chinwendu V, NP   650 mg at 10/19/21 2134   alum & mag hydroxide-simeth (MAALOX/MYLANTA) 200-200-20 MG/5ML suspension 30 mL  30 mL Oral Q4H PRN Onuoha, Chinwendu V, NP       doxycycline (VIBRA-TABS) tablet 100 mg  100 mg Oral Q12H Onuoha, Chinwendu V, NP   100 mg at 10/20/21 0808   levonorgestrel-ethinyl estradiol (ALESSE) 0.1-20 MG-MCG per tablet 2 tablet  2 tablet Oral Once Massengill, Ovid Curd, MD       Followed by   levonorgestrel-ethinyl estradiol (ALESSE) 0.1-20 MG-MCG per tablet 1 tablet  1 tablet Oral QAC supper Massengill, Nathan, MD       magnesium hydroxide (MILK OF MAGNESIA) suspension 30 mL  30 mL Oral Daily PRN Onuoha, Chinwendu V, NP       sertraline (ZOLOFT) tablet 25 mg  25 mg Oral Daily Carrion-Carrero, Margely, MD   25 mg at 10/20/21 0277   traZODone (DESYREL) tablet 50 mg  50 mg Oral QHS PRN Onuoha, Chinwendu V, NP   50 mg at 10/19/21 2134   PTA Medications: Medications Prior to Admission  Medication Sig Dispense Refill Last Dose   doxycycline (VIBRA-TABS) 100 MG tablet Take 100 mg by mouth 2 (two) times daily. Filled #14 10/07/21      VIENVA 0.1-20 MG-MCG tablet Take 1 tablet by mouth daily.       Patient Stressors: Other: emotional disregularity    Patient Strengths: Ability for insight  Average or above average intelligence  Capable of independent  living  Communication skills  General fund of knowledge  Physical Health   Treatment Modalities: Medication Management, Group therapy, Case management,  1 to 1 session with clinician, Psychoeducation, Recreational therapy.   Physician Treatment Plan for Primary Diagnosis: MDD (major depressive disorder), recurrent severe, without psychosis ([place]) Long Term Goal(s): Improvement in symptoms so as ready for discharge   Short Term Goals: Ability to demonstrate self-control will improve Ability to identify and develop effective coping behaviors will improve Ability to maintain clinical measurements within normal limits will improve  Medication Management: Evaluate patient's response, side effects, and tolerance of medication regimen.  Therapeutic Interventions: 1 to 1 sessions, Unit Group sessions and Medication administration.  Evaluation of Outcomes: Not Met  Physician Treatment Plan for Secondary Diagnosis: Principal Problem:   MDD (major depressive disorder), recurrent severe, without psychosis ([place]) Active Problems:   Borderline personality disorder ([place])   Anxiety state  Long Term Goal(s): Improvement in symptoms so as ready for discharge   Short Term Goals: Ability to demonstrate self-control will improve Ability to identify and develop effective coping behaviors will improve Ability to maintain clinical measurements within normal limits will improve     Medication Management: Evaluate patient's response, side effects, and tolerance of medication regimen.  Therapeutic Interventions: 1 to 1 sessions, Unit Group sessions and Medication administration.  Evaluation of Outcomes: Not Met   RN Treatment Plan for Primary Diagnosis: MDD (major depressive disorder), recurrent severe, without psychosis ([place]) Long  Term Goal(s): Knowledge of disease and therapeutic regimen to maintain health will improve  Short Term Goals: Ability to remain free from injury will improve, Ability to  verbalize frustration and anger appropriately will improve, Ability to demonstrate self-control, Ability to participate in decision making will improve, Ability to verbalize feelings will improve, Ability to disclose and discuss suicidal ideas, Ability to identify and develop effective coping behaviors will improve, and Compliance with prescribed medications will improve  Medication Management: RN will administer medications as ordered by provider, will assess and evaluate patient's response and provide education to patient for prescribed medication. RN will report any adverse and/or side effects to prescribing provider.  Therapeutic Interventions: 1 on 1 counseling sessions, Psychoeducation, Medication administration, Evaluate responses to treatment, Monitor vital signs and CBGs as ordered, Perform/monitor CIWA, COWS, AIMS and Fall Risk screenings as ordered, Perform wound care treatments as ordered.  Evaluation of Outcomes: Not Met   LCSW Treatment Plan for Primary Diagnosis: MDD (major depressive disorder), recurrent severe, without psychosis ([place]) Long Term Goal(s): Safe transition to appropriate next level of care at discharge, Engage patient in therapeutic group addressing interpersonal concerns.  Short Term Goals: Engage patient in aftercare planning with referrals and resources, Increase social support, Increase ability to appropriately verbalize feelings, Increase emotional regulation, Facilitate acceptance of mental health diagnosis and concerns, Facilitate patient progression through stages of change regarding substance use diagnoses and concerns, Identify triggers associated with mental health/substance abuse issues, and Increase skills for wellness and recovery  Therapeutic Interventions: Assess for all discharge needs, 1 to 1 time with Social worker, Explore available resources and support systems, Assess for adequacy in community support network, Educate family and significant other(s)  on suicide prevention, Complete Psychosocial Assessment, Interpersonal group therapy.  Evaluation of Outcomes: Not Met   Progress in Treatment: Attending groups: Yes. Participating in groups: Yes. Taking medication as prescribed: Yes. Toleration medication: Yes. Family/Significant other contact made: Yes, individual(s) contacted:  Denyce Robert [phone] (Mother)  Patient understands diagnosis: Yes. Discussing patient identified problems/goals with staff: Yes. Medical problems stabilized or resolved: Yes. Denies suicidal/homicidal ideation: No Issues/concerns per patient self-inventory: No.  New problem(s) identified: No, Describe:  none reported  New Short Term/Long Term Goal(s):   medication stabilization, elimination of SI thoughts, development of comprehensive mental wellness plan.    Patient Goals:  Patient states, "be able to be on the right medication"  Discharge Plan or Barriers:  Patient recently admitted. CSW will continue to follow and assess for appropriate referrals and possible discharge planning.    Reason for Continuation of Hospitalization: Anxiety Depression Medication stabilization Suicidal ideation  Estimated Length of Stay: 3-7 days  Last 3 [place] Suicide Severity Risk Score: [place] Admission (Current) from 10/19/2021 in [facility] ED from 10/18/2021 in [facility] ED from 08/24/2021 in [place] High Risk High Risk No Risk       Last PHQ 2/9 Scores:    10/18/2021    5:46 PM  Depression screen PHQ 2/9  Decreased Interest 2  Down, Depressed, Hopeless 2  PHQ - 2 Score 4  Altered sleeping 0  Tired, decreased energy 1  Change in appetite 1  Feeling bad or failure about yourself  2  Trouble concentrating 1  Moving slowly or fidgety/restless 0  Suicidal thoughts 3  PHQ-9 Score 12    Scribe for Treatment  Team: Zachery Conch, LCSW 10/20/2021 11:02 AM
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
## 2021-10-20 NOTE — BHH Group Notes (Signed)
Pt attended orientation/goals group. MHT went over schedule for today with patients. Pt goal for today is to focus on coping mechanisms.

## 2021-10-20 NOTE — Progress Notes (Signed)
Patient denies SI, HI and AVH. Patient reported abdominal pain related to STI and complained of a possible yeast infection. Writer reported patient's complaint to treatment team.   Assess patient for safety, assess patient for improvement of symptoms the ABX treatment, engage patient in 1:1 therapeutic staff talks.   Patient able to contract for safety. Continue to monitor as planned.

## 2021-10-21 MED ORDER — WHITE PETROLATUM EX OINT
TOPICAL_OINTMENT | CUTANEOUS | Status: AC
  Filled 2021-10-21: qty 5

## 2021-10-21 MED ORDER — ONDANSETRON HCL 4 MG PO TABS
4.0000 mg | ORAL_TABLET | Freq: Three times a day (TID) | ORAL | Status: DC | PRN
  Administered 2021-10-21 – 2021-10-22 (×2): 4 mg via ORAL
  Filled 2021-10-21 (×2): qty 1

## 2021-10-21 NOTE — Progress Notes (Signed)
   10/21/21 2038  Psych Admission Type (Psych Patients Only)  Admission Status Voluntary  Psychosocial Assessment  Patient Complaints None  Eye Contact Fair  Facial Expression Animated  Affect Appropriate to circumstance  Speech Logical/coherent  Interaction Assertive  Motor Activity Other (Comment) (wnl)  Appearance/Hygiene Unremarkable  Behavior Characteristics Cooperative;Appropriate to situation  Mood Pleasant  Thought Process  Coherency WDL  Content WDL  Delusions None reported or observed  Perception WDL  Hallucination None reported or observed  Judgment WDL  Confusion None  Danger to Self  Current suicidal ideation? Denies  Danger to Others  Danger to Others None reported or observed   Progress note   D: Pt seen in dayroom. Pt denies SI, HI, AVH. Pt rates pain  0/10. Pt rates anxiety  0/10 and depression  0/10. Pt is pleasant and upbeat. Pt endorses good appetite, sleep and has been attending groups. Pt spoke with father tonight and was tearful. He said he missed her and was looking forward to seeing her after discharge tomorrow. "My parents are supportive but it took a long time. In [place] culture, we don't discuss mental health. I was here when I was 16 and didn't like it, but now, I know that being here now is a blessing in disguise. I needed help and I want to be better for myself and my younger siblings." Pt states that she will return to her apartment until her lease is up and then stay with her mother. "She is having surgery next month and I want to be the one who is there to help her. She has always been there for me and I want to be there for her." Pt denies any other complaints.   A: Pt provided support and encouragement. Pt given scheduled medication as prescribed. PRNs as appropriate. Q15 min checks for safety.   R: Pt safe on the unit. Will continue to monitor.

## 2021-10-21 NOTE — Progress Notes (Addendum)
[facility] MD Progress Note  10/21/2021 12:30 PM Sylvia Gray  MRN:  [id number] Subjective:    Patient is a 21 year old female with a past psychiatric history of major depressive disorder and bipolar type 1 disorder (patient does not appear to meet criteria), who was admitted voluntarily on 10/19/2021 to the [facility] after attempting suicide by ingesting six, 800 mg ibuprofen tablets.   Yesterday, the following recommendations were made:  -Increase Zoloft to 50 mg daily - For anxiety and MDD  Interval History: PRN Medications administered within the last 24 hours: tylenol x1, trazodon x1 Per nursing staff: participated well in 2 groups   Per Patient:  On assessment today, the patient reports some mild anxiety but states that she otherwise feels well.  She reports an improving mood and appears relatively euthymic.  She denies SI, HI, or AVH. She states she feels the medications are working well and denies medication side-effects other than some mild nausea associated with start of her menses. Her plan after discharge is to go back to living in her apartment with "my father right around the corner".  She reports follow-up with the PCP at [facility] for her PID.   Discussed with patient rescinding her 72-hour discharge form.  She was amenable.  Later notified by nursing the patient did rescind her 72-hour form.  Principal Problem: MDD (major depressive disorder), recurrent severe, without psychosis (HCC) Diagnosis: Principal Problem:   MDD (major depressive disorder), recurrent severe, without psychosis (HCC) Active Problems:   Borderline personality disorder (HCC)   Anxiety state  Total Time Spent in Direct Patient Care:  I personally spent 30 minutes on the unit in direct patient care. The direct patient care time included face-to-face time with the patient, reviewing the patient's chart, communicating with other professionals, and coordinating care. Greater than  50% of this time was spent in counseling or coordinating care with the patient regarding goals of hospitalization, psycho-education, and discharge planning needs.   Past Psychiatric History: Past Psychiatric Hx: Previous Psych Diagnoses: -Major Depressive Disorder  -Bipolar Disorder Type 1   Prior inpatient treatment: - In 2018, [facility] (3 days) and transfer to [facility] due to suicide attempt with overdose of pills. Current/prior outpatient treatment: Patient is unable to specify and has unclear/inconsistent outpatient psychiatric care.   Prior rehab hx: Denies Psychotherapy OF:[facility]   History of suicide: - Age 15 (2018), patient refers suicide attempt with pills. - Age [age] , patient refers suicide attempt by cutting neck with scissors. History of homicide: Denies  Past Medical History:  Past Medical History:  Diagnosis Date   Anxiety    Bipolar 1 disorder (HCC)    Depression    Medical history non-contributory     Past Surgical History:  Procedure Laterality Date   NO PAST SURGERIES     WISDOM TOOTH EXTRACTION     Family History: History reviewed. No pertinent family history. Family Psychiatric  History:  Psych:  -Mother: Major depressive disorder. Psych Rx: denies SA/HA: Substance use family hx: denies Social History:  Social History   Substance and Sexual Activity  Alcohol Use Yes   Comment: occ     Social History   Substance and Sexual Activity  Drug Use Never    Social History   Socioeconomic History   Marital status: Single    Spouse name: Not on file   Number of children: Not on file   Years of education: Not on file  Highest education level: Not on file  Occupational History   Not on file  Tobacco Use   Smoking status: Never   Smokeless tobacco: Never  Vaping Use   Vaping Use: Every day  Substance and Sexual Activity   Alcohol use: Yes    Comment: occ   Drug use: Never   Sexual activity: Yes    Birth  control/protection: Pill  Other Topics Concern   Not on file  Social History Narrative   Not on file   Social Determinants of Health   Financial Resource Strain: Not on file  Food Insecurity: Not on file  Transportation Needs: Not on file  Physical Activity: Not on file  Stress: Not on file  Social Connections: Not on file   Additional Social History:    Sleep: Fair  Appetite:  Fair  Current Medications: Current Facility-Administered Medications  Medication Dose Route Frequency Provider Last Rate Last Admin   acetaminophen (TYLENOL) tablet 650 mg  650 mg Oral Q6H PRN Onuoha, Chinwendu V, NP   650 mg at 10/20/21 2137   alum & mag hydroxide-simeth (MAALOX/MYLANTA) 200-200-20 MG/5ML suspension 30 mL  30 mL Oral Q4H PRN Onuoha, Chinwendu V, NP       clotrimazole (GYNE-LOTRIMIN) vaginal cream 1 Applicatorful  1 Applicatorful Vaginal QHS Carlyn Reichert, MD   1 Applicatorful at 10/20/21 2137   doxycycline (VIBRA-TABS) tablet 100 mg  100 mg Oral Q12H Onuoha, Chinwendu V, NP   100 mg at 10/21/21 6269   levonorgestrel-ethinyl estradiol (ALESSE) 0.1-20 MG-MCG per tablet 2 tablet  2 tablet Oral Once Massengill, Harrold Donath, MD       Followed by   levonorgestrel-ethinyl estradiol (ALESSE) 0.1-20 MG-MCG per tablet 1 tablet  1 tablet Oral QAC supper Massengill, Harrold Donath, MD       magnesium hydroxide (MILK OF MAGNESIA) suspension 30 mL  30 mL Oral Daily PRN Onuoha, Chinwendu V, NP       ondansetron (ZOFRAN) tablet 4 mg  4 mg Oral Q8H PRN Carlyn Reichert, MD       sertraline (ZOLOFT) tablet 50 mg  50 mg Oral Daily Carlyn Reichert, MD   50 mg at 10/21/21 0804   traZODone (DESYREL) tablet 50 mg  50 mg Oral QHS PRN Onuoha, Chinwendu V, NP   50 mg at 10/20/21 2136    Lab Results:  No results found for this or any previous visit (from the past 48 hour(s)).   Blood Alcohol level:  Lab Results  Component Value Date   ETH <10 10/18/2021   ETH <5 06/30/2016    Metabolic Disorder Labs: Lab Results   Component Value Date   HGBA1C 4.6 (L) 10/18/2021   MPG 85.32 10/18/2021   No results found for: "PROLACTIN" Lab Results  Component Value Date   CHOL 217 (H) 10/18/2021   TRIG 52 10/18/2021   HDL 56 10/18/2021   CHOLHDL 3.9 10/18/2021   VLDL 10 10/18/2021   LDLCALC 151 (H) 10/18/2021    Physical Findings: AIMS:  , ,  ,  ,    CIWA:    COWS:     Musculoskeletal: Strength & Muscle Tone: within normal limits Gait & Station: normal Patient leans: N/A  Psychiatric Specialty Exam:  Presentation  General Appearance: Appropriate for Environment; Casual  Eye Contact:Good  Speech:Clear and Coherent; Normal Rate  Speech Volume:Normal  Handedness:Right   Mood and Affect  Mood: "good" Affect: euthymic  Thought Process  Thought Processes:Coherent  Descriptions of Associations:Intact  Orientation:Full (Time, Place and Person)  Thought Content:Logical  History of Schizophrenia/Schizoaffective disorder:No  Hallucinations:Denied  Ideas of Reference:None  Suicidal Thoughts: denies (has denied for 3d)  Homicidal Thoughts: denies   Sensorium  Memory:Remote Good; Immediate Good  Judgment: fair Insight: fair, improving  Executive Functions  Concentration:Fair  Attention Span:Fair  Recall:Good  Fund of Knowledge:Fair  Language:Fair   Psychomotor Activity  Psychomotor Activity: nml   Assets  Assets:Communication Skills; Desire for Improvement; Housing; Physical Health; Resilience; Social Support   Sleep  Sleep: fair   Physical Exam Constitutional:      Appearance: the patient is not toxic-appearing.  Pulmonary:     Effort: Pulmonary effort is normal.  Neurological:     General: No focal deficit present.     Mental Status: the patient is alert and oriented to person, place, and time.   Review of Systems  Positive for nausea, negative for vomiting  Blood pressure 118/72, pulse 92, temperature 98.6 F (37 C), temperature source Oral, resp.  rate 16, height 5\' 3"  (1.6 m), weight 48.2 kg, last menstrual period 10/10/2021, SpO2 100 %. Body mass index is 18.81 kg/m.   ASSESSMENT:   Diagnoses / Active Problems: -Major Depressive Disorder, recurrent severe, without psychosis -Borderline personality disorder, rule out bipolar disorder type 1 -Anxiety   PLAN: Safety and Monitoring:             -- Voluntary admission to inpatient psychiatric unit for safety, stabilization and treatment             -- Daily contact with patient to assess and evaluate symptoms and progress in treatment             -- Patient's case to be discussed in multi-disciplinary team meeting             -- Observation Level : q15 minute checks             -- Vital signs:  q12 hours             -- Precautions: suicide, elopement, and assault   2. Psychiatric Diagnoses and Treatment:   -Major Depressive Disorder, recurrent severe, without psychosis -Borderline personality disorder, rule out bipolar disorder type 1 -Anxiety -Continue Zoloft 50 mg - For anxiety and MDD -- The risks/benefits/side-effects/alternatives to this medication were discussed in detail with the patient and time was given for questions. The patient consents to medication trial.              -- Encouraged patient to participate in unit milieu and in scheduled group therapies                     3. Medical Issues Being Addressed:   PID             -Continue Doxycycline 100 mg twice daily for pelvic inflammatory disease.  -Continue Monistat for yeast infection   Low TSH   - Checking FT4 and FT3   4. Discharge Planning:              -- Social work and case management to assist with discharge planning and identification of hospital follow-up needs prior to discharge             -- Estimated LOS: 5-7 days             -- Discharge Concerns: Need to establish a safety plan; Medication compliance and effectiveness             -- Discharge Goals: Return home with  outpatient referrals for  mental health follow-up including medication management/psychotherapy   Treatment Plan Summary: Daily contact with patient to assess and evaluate symptoms and progress in treatment and Medication management   Carlyn Reichert, MD 10/21/2021, 12:30 PM

## 2021-10-21 NOTE — Progress Notes (Signed)
D. Pt reports an improved mood today- smiles during interactions. Per pt's self inventory, pt rated her depression, hopelessness and anxiety a 0/0/2, respectively. Pt has been visible in the milieu interacting well with peers and attending groups. Pt currently denies SI/HI and AVH A. Labs and vitals monitored. Pt given and educated on medications. Pt supported emotionally and encouraged to express concerns and ask questions.   R. Pt remains safe with 15 minute checks. Will continue POC.

## 2021-10-21 NOTE — BHH Group Notes (Signed)
Adult Psychoeducational Group Note  Date:  10/21/2021 Time:  9:33 PM  Group Topic/Focus:  Wrap-Up Group:   The focus of this group is to help patients review their daily goal of treatment and discuss progress on daily workbooks.  Participation Level:  Active  Participation Quality:  Inattentive, Monopolizing, and Supportive  Affect:  Appropriate  Cognitive:  Alert and Appropriate  Insight: Appropriate  Engagement in Group:  Distracting, Monopolizing, and Supportive  Modes of Intervention:  Discussion, Education, and Socialization  Additional Comments:  Pt attended and participated in wrap up group this evening and rated their day a 10/10, due to them D/C tomorrow. Pt completed their goal, which was to learn how to convey their needs and to make better choices. Once D/C pt plans to be more open and not to shut themselves off from their family.   Chrisandra Netters 10/21/2021, 9:33 PM

## 2021-10-21 NOTE — Progress Notes (Signed)
Pt c/o nausea. Says it happens when she is on her menstrual cycle. PRNs given as appropriate.

## 2021-10-21 NOTE — Progress Notes (Signed)
Pt is now sleeping. Did have episode of emesis after taking Zofran. Given Gatorade. Says it is rare but that she has been know to have nausea with menses. Will continue to monitor.

## 2021-10-21 NOTE — Group Note (Signed)
LCSW Group Therapy Note  10/21/2021      Topic:  Anger Healthy and Unhealthy Coping Skills  Participation Level:  Active  Description of Group:   In this group, patients identified their own common triggers and typical reactions then analyzed how these reactions are possibly beneficial and possibly unhelpful.  Focus was placed on examining whether typical coping skills are healthy or unhealthy.  Therapeutic Goals: Patients will share situations that commonly incite their anger and how they typically respond Patients will identify how their coping skills work for them and/or against them Patients will explore possible alternative coping skills Patients will learn that anger itself is normal and that healthier reactions can assist with resolving conflict rather than worsening situations  Summary of Patient Progress:  The patient shared that her frequent cause of anger is when people don't respect boundaries and another is when people make her mental health issues out to be invalid.  Choice of coping skill is often to tell people off, which was identified as an unhealthy choice because it does not help.  She spoke frequently throughout group, to the point of almost monopolizing, but could with effort be redirected.  She spoke of how angry she gets at her mother because mom is always trying to give her directions of what to do - this led to a long discussion about how her mother cannot read her mind so she will need to tell her mother she just needs someone to listen.  Therapeutic Modalities:   Cognitive Behavioral Therapy Processing  Lynnell Chad

## 2021-10-21 NOTE — BHH Group Notes (Signed)
.  Psychoeducational Group Note    Date:  78//23 Time: 1300-1400    Purpose of Group: . The group focus' on teaching patients on how to identify their needs and their Life Skills:  A group where two lists are made. What people need and what are things that we do that are unhealthy. The lists are developed by the patients and it is explained that we often do the actions that are not healthy to get our list of needs met.  Goal:: to develop the coping skills needed to get their needs met  Participation Level:  Active  Participation Quality:  Appropriate  Affect:  Appropriate  Cognitive:  Oriented  Insight: Improving  Engagement in Group:  Engaged  Modes of Intervention:  Activity, Discussion, Education, and Support  Additional Comments:  Rates energy at a 10/10. Participated fully in the group.  Paulino Rily

## 2021-10-21 NOTE — BHH Group Notes (Signed)
Goals Group 78/2023   Group Focus: affirmation, clarity of thought, and goals/reality orientation Treatment Modality:  Psychoeducation Interventions utilized were assignment, group exercise, and support Purpose: To be able to understand and verbalize the reason for their admission to the hospital. To understand that the medication helps with their chemical imbalance but they also need to work on their choices in life. To be challenged to develop a list of 30 positives about themselves. Also introduce the concept that "feelings" are not reality.  Participation Level:  did not attend  Brodie Correll A 

## 2021-10-22 LAB — T4, FREE: Free T4: 1.13 ng/dL — ABNORMAL HIGH (ref 0.61–1.12)

## 2021-10-22 MED ORDER — CLOTRIMAZOLE 1 % VA CREA: 1.0000 | TOPICAL_CREAM | Freq: Every day | VAGINAL | 0 refills | Status: AC

## 2021-10-22 MED ORDER — SERTRALINE HCL 50 MG PO TABS
50.0000 mg | ORAL_TABLET | Freq: Every day | ORAL | 0 refills | Status: DC
Start: 2021-10-23 — End: 2022-05-31

## 2021-10-22 NOTE — Progress Notes (Signed)
D. Pt presented as friendly, other than having a headache, pt voiced no complaints this am. Per pt's self inventory, pt rated her depression, hopelessness and anxiety all 0's. Pt has been visible in the milieu interacting well with peers, and observed attending groups. Pt reported that she was looking forward to discharging today, and her goal was to work on staying on her meds and to lean on her support system. Pt currently denies SI/HI and AVH  A. Labs and vitals monitored. Pt given scheduled meds, and Tylenol for headache rated 7/10  Pt supported emotionally and encouraged to express concerns and ask questions.   R. Pt remains safe with 15 minute checks. Will continue POC.

## 2021-10-22 NOTE — BHH Group Notes (Signed)
Adult Psychoeducational Group Note Date:  10/22/2021 Time:  0900-1045 Group Topic/Focus: PROGRESSIVE RELAXATION. A group where deep breathing is taught and tensing and relaxation muscle groups is used. Imagery is used as well.  Pts are asked to imagine 3 pillars that hold them up when they are not able to hold themselves up and to share that with the group.  Participation Level:  Active  Participation Quality:  Appropriate  Affect:  Appropriate  Cognitive:  Oriented  Insight: Improving  Engagement in Group:  Engaged  Modes of Intervention:  Activity, Discussion, Education, and Support  Additional Comments:    Dione Housekeeper

## 2021-10-22 NOTE — BHH Suicide Risk Assessment (Cosign Needed)
[facility] Discharge Suicide Risk Assessment   Principal Problem: MDD (major depressive disorder), recurrent severe, without psychosis (HCC) Discharge Diagnoses: Principal Problem:   MDD (major depressive disorder), recurrent severe, without psychosis (HCC) Active Problems:   Borderline personality disorder (HCC)   Anxiety state  Sylvia Gray is a 21 year old female with a past psychiatric history of major depressive disorder and bipolar type 1 disorder (patient does not appear to meet criteria), who was admitted voluntarily on 10/19/2021 to the [facility] after attempting suicide by ingesting six, 800 mg ibuprofen tablets.   HOSPITAL COURSE:  During the patient's hospitalization, patient had extensive initial psychiatric evaluation, and follow-up psychiatric evaluations every day.  Psychiatric diagnoses provided upon initial assessment:  -Major Depressive Disorder, recurrent severe, without psychosis -Borderline personality disorder, rule out bipolar disorder type 1 -Anxiety  Patient's psychiatric medications were adjusted on admission:  Start Zoloft 25 mg for anxiety and MDD  During the hospitalization, other adjustments were made to the patient's psychiatric medication regimen:  Increase Zoloft to 50 mg daily for anxiety and MDD  Patient's care was discussed during the interdisciplinary team meeting every day during the hospitalization.  The patient denied having side effects to prescribed psychiatric medication.  Gradually, patient started adjusting to milieu. The patient was evaluated each day by a clinical provider to ascertain response to treatment. Improvement was noted by the patient's report of decreasing symptoms, improved sleep and appetite, affect, medication tolerance, behavior, and participation in unit programming.  Patient was asked each day to complete a self inventory noting mood, mental status, pain, new symptoms, anxiety and concerns.     Symptoms were reported as significantly decreased or resolved completely by discharge.   On day of discharge, the patient reports that their mood is stable. The patient denied having suicidal thoughts for more than 48 hours prior to discharge.  Patient denies having homicidal thoughts.  Patient denies having auditory hallucinations.  Patient denies any visual hallucinations or other symptoms of psychosis. The patient was motivated to continue taking medication with a goal of continued improvement in mental health.   The patient reports their target psychiatric symptoms of depression and anxiety responded well to the psychiatric medications, and the patient reports overall benefit other psychiatric hospitalization. Supportive psychotherapy was provided to the patient. The patient also participated in regular group therapy while hospitalized. Coping skills, problem solving as well as relaxation therapies were also part of the unit programming.  Labs were reviewed with the patient, and abnormal results were discussed with the patient. (See below)  The patient is able to verbalize their individual safety plan to this provider.  # It is recommended to the patient to continue psychiatric medications as prescribed, after discharge from the hospital.    # It is recommended to the patient to follow up with your outpatient psychiatric provider and PCP [facility])  # It was discussed with the patient, the impact of alcohol, drugs, tobacco have been there overall psychiatric and medical wellbeing, and total abstinence from substance use was recommended the patient.ed.  # Prescriptions provided or sent directly to preferred pharmacy at discharge. Patient agreeable to plan. Given opportunity to ask questions. Appears to feel comfortable with discharge.    # In the event of worsening symptoms, the patient is instructed to call the crisis hotline, 911 and or go to the nearest ED for appropriate  evaluation and treatment of symptoms. To follow-up with primary care provider for other medical issues, concerns and  or health care needs  # Patient was discharged home with a plan to follow up as noted below.   Total Time spent with patient: 15 minutes  Musculoskeletal: Strength & Muscle Tone: within normal limits Gait & Station: normal Patient leans: N/A  Psychiatric Specialty Exam:   Presentation  General Appearance: Appropriate for Environment; Casual   Eye Contact:Good   Speech:Clear and Coherent; Normal Rate   Speech Volume:Normal   Handedness:Right     Mood and Affect  Mood: "good" Affect: euthymic   Thought Process  Thought Processes:Coherent   Descriptions of Associations:Intact   Orientation:Full (Time, Place and Person)   Thought Content:Logical   History of Schizophrenia/Schizoaffective disorder:No   Hallucinations:Denied   Ideas of Reference:None   Suicidal Thoughts: denies (has denied for 4d)   Homicidal Thoughts: denies     Sensorium  Memory:Remote Good; Immediate Good   Judgment: fair Insight: fair, improving   Executive Functions  Concentration:Fair   Attention Span:Fair   Recall:Good   Fund of Knowledge:Fair   Language:Fair     Psychomotor Activity  Psychomotor Activity: nml     Assets  Assets:Communication Skills; Desire for Improvement; Housing; Physical Health; Resilience; Social Support     Sleep  Sleep: fair     Physical Exam Constitutional:      Appearance: the patient is not toxic-appearing.  Pulmonary:     Effort: Pulmonary effort is normal.  Neurological:     General: No focal deficit present.     Mental Status: the patient is alert and oriented to person, place, and time.    Review of Systems  Positive for nausea (patient feels this is menstrual related), negative for vomiting  Blood pressure 99/79, pulse (!) 111, temperature 98.2 F (36.8 C), temperature source Oral, resp. rate 16, height 5\' 3"   (1.6 m), weight 48.2 kg, last menstrual period 10/10/2021, SpO2 100 %. Body mass index is 18.81 kg/m.  Mental Status Per Nursing Assessment::   On Admission:  Self-harm behaviors  Demographic Factors:  Adolescent or young adult  Loss Factors: NA  Historical Factors: Prior suicide attempts  Risk Reduction Factors:   Sense of responsibility to family and Positive social support  Continued Clinical Symptoms:  Depression, impulsivity   Cognitive Features That Contribute To Risk:  None    Suicide Risk:  Mild: Patient with a history of 2 previous suicide attempt.  Currently has no identifiable suicidal thoughts and has denied having suicidal thoughts for the past 4 days.  She has engaged well in unit programming and is demonstrated good emotional stability and behavior regulation.  She is future oriented and goal-directed.  She has strong social support from her family.   Follow-up Information     BEHAVIORAL HEALTH PARTIAL HOSPITALIZATION PROGRAM Follow up on 10/31/2021.   Specialty: Behavioral Health Why: You are scheduled for an assessment for the PHP on Tuesday, 7/18 at 10:00 am. This appointment will last approximately one hour and will be virtual via Webex. PHP is virtual group therapy that runs Mon-Fri from 9am-1pm. Please download the [employer] app prior to the appointment. If you need to cancel or reschedule, please call [phone] Contact information: [address], [place]. Go on 10/30/2021.   Why: You have an appointment for therapy services on 10/30/21 at 5:00 pm.   This appointment will be held in person (but may be changed to Virtual) Contact information: [address] [place], [place] [address] [phone]         [facility] Go on 11/13/2021.   Why: You have an appointment for medication management services on 11/13/21 at 3:00 pm.    This appointment will be held in person. Contact information: [address] [place] [address] [phone]                 Plan Of Care/Follow-up recommendations:  Activity: as tolerated  Diet: heart healthy  Other: -Follow-up with your outpatient psychiatric provider -instructions on appointment date, time, and address (location) are provided to you in discharge paperwork.  -Take your psychiatric medications as prescribed at discharge - instructions are provided to you in the discharge paperwork  -Follow-up with outpatient primary care doctor and other specialists - for management of chronic medical disease, including: PID, possible hyperthyroidism (see below), and abnormal lipids (see below)  -Testing: Follow-up with outpatient provider for abnormal lab results:  TSH of 0.32, fT3 pending, fT4 of 1.13 LDL of 151, chol of 217 (6 PM draw) UA with 100 protein  -Recommend abstinence from alcohol, tobacco, and other illicit drug use at discharge.   -If your psychiatric symptoms recur, worsen, or if you have side effects to your psychiatric medications, call your outpatient psychiatric provider, 911, [phone] or go to the nearest emergency department.  -If suicidal thoughts recur, call your outpatient psychiatric provider, 911, [phone] or go to the nearest emergency department.   Carlyn Reichert, MD 10/22/2021, 9:55 AM

## 2021-10-22 NOTE — Progress Notes (Signed)
  [facility] Adult Case Management Discharge Plan :  Will you be returning to the same living situation after discharge:  Yes,  home At discharge, do you have transportation home?: Yes,  parents Do you have the ability to pay for your medications: Yes,  insurance  Release of information consent forms completed and emailed to Medical Records, then turned in to Medical Records by CSW.   Patient to Follow up at:  Follow-up Information     BEHAVIORAL HEALTH PARTIAL HOSPITALIZATION PROGRAM Follow up on 10/31/2021.   Specialty: Behavioral Health Why: You are scheduled for an assessment for the PHP on Tuesday, 7/18 at 10:00 am. This appointment will last approximately one hour and will be virtual via Webex. PHP is virtual group therapy that runs Mon-Fri from 9am-1pm. Please download the [employer] app prior to the appointment. If you need to cancel or reschedule, please call [phone] Contact information: [address] [place] [address] [phone]        [facility]. Go on 10/30/2021.   Why: You have an appointment for therapy services on 10/30/21 at 5:00 pm.   This appointment will be held in person (but may be changed to Virtual) Contact information: [address] [place], [place] [address] [phone]         [facility] Go on 11/13/2021.   Why: You have an appointment for medication management services on 11/13/21 at 3:00 pm.   This appointment will be held in person. Contact information: [address] [place] [address] [phone]                 Next level of care provider has access to [facility] Link:no  Safety Planning and Suicide Prevention discussed: Yes,  with mother     Has patient been referred to the Quitline?: N/A patient is not a smoker  Patient has been referred for addiction treatment: N/A  Lynnell Chad, LCSW 10/22/2021, 9:11 AM

## 2021-10-22 NOTE — Progress Notes (Signed)
Pt discharged to lobby with mother present.  Pt was stable and appreciative at that time. All papers and electronic prescriptions were given. Pt had no belongings to return from locker, only belongings at bedside. Follow up instructions reviewed with patient.  Verbal understanding expressed. Denies SI/HI and A/VH. Pt given opportunity to express concerns and ask questions.

## 2021-10-22 NOTE — Group Note (Signed)
[facility] LCSW Group Therapy Note  Date/Time:  10/22/2021 10:00am-11:00am  Type of Therapy and Topic:  Group Therapy:  Healthy and Unhealthy Supports  Participation Level:  Active   Description of Group:  Patients in this group were introduced to the idea of adding a variety of healthy supports to address the various needs in their lives, especially in reference to their plans and focus for the new year.  Patients discussed what additional healthy supports could be helpful in their recovery and wellness after discharge in order to prevent future hospitalizations such as counselor, doctor, other levels of psychiatric care such as ACTT services, therapy groups, 12-step groups, and problem-specific support groups.  A demonstration was given about how to set boundaries which patients expressed was beneficial.  Several songs were played to inspire patients to be more self-supportive.  Therapeutic Goals:   1)  discuss importance of adding supports to stay well once out of the hospital  2)  compare healthy versus unhealthy supports and identify some examples of each  3)  generate ideas and descriptions of healthy supports that can be added  4)  offer mutual support about how to address unhealthy supports  5)  encourage active participation in and adherence to discharge plan    Summary of Patient Progress:  The patient stated that current healthy supports in her life are her parents, siblings, stepmother who all understand better now that she has been hospitalized that she has had trauma which has caused her to be the way she is.  She stated they are now willing to change.  The patient expressed that she herself can be an unhealthy support because she self-harms and self-blames.   Therapeutic Modalities:   Motivational Interviewing Brief Solution-Focused Therapy  Ambrose Mantle, LCSW
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
## 2021-10-23 LAB — T3, FREE: T3, Free: 3.2 pg/mL (ref 2.0–4.4)

## 2021-10-25 ENCOUNTER — Emergency Department (HOSPITAL_BASED_OUTPATIENT_CLINIC_OR_DEPARTMENT_OTHER): Payer: Medicaid Other

## 2021-10-25 ENCOUNTER — Emergency Department (HOSPITAL_BASED_OUTPATIENT_CLINIC_OR_DEPARTMENT_OTHER)
Admission: EM | Admit: 2021-10-25 | Discharge: 2021-10-25 | Disposition: A | Discharge status: Home / Self Care | Payer: Medicaid Other | Attending: Emergency Medicine | Admitting: Emergency Medicine

## 2021-10-25 ENCOUNTER — Encounter (HOSPITAL_BASED_OUTPATIENT_CLINIC_OR_DEPARTMENT_OTHER): Payer: Self-pay | Admitting: Emergency Medicine

## 2021-10-25 DIAGNOSIS — N730 Acute parametritis and pelvic cellulitis: Secondary | ICD-10-CM

## 2021-10-25 DIAGNOSIS — R102 Pelvic and perineal pain: Secondary | ICD-10-CM | POA: Diagnosis present

## 2021-10-25 DIAGNOSIS — B9689 Other specified bacterial agents as the cause of diseases classified elsewhere: Secondary | ICD-10-CM | POA: Diagnosis not present

## 2021-10-25 DIAGNOSIS — R739 Hyperglycemia, unspecified: Secondary | ICD-10-CM | POA: Diagnosis not present

## 2021-10-25 DIAGNOSIS — N760 Acute vaginitis: Secondary | ICD-10-CM | POA: Insufficient documentation

## 2021-10-25 DIAGNOSIS — N739 Female pelvic inflammatory disease, unspecified: Secondary | ICD-10-CM | POA: Diagnosis not present

## 2021-10-25 HISTORY — DX: Borderline personality disorder: F60.3

## 2021-10-25 LAB — CBC WITH DIFFERENTIAL/PLATELET
Abs Immature Granulocytes: 0.01 10*3/uL (ref 0.00–0.07)
Basophils Absolute: 0 10*3/uL (ref 0.0–0.1)
Basophils Relative: 1 %
Eosinophils Absolute: 0 10*3/uL (ref 0.0–0.5)
Eosinophils Relative: 1 %
HCT: 41.6 % (ref 36.0–46.0)
Hemoglobin: 14 g/dL (ref 12.0–15.0)
Immature Granulocytes: 0 %
Lymphocytes Relative: 43 %
Lymphs Abs: 1.7 10*3/uL (ref 0.7–4.0)
MCH: 32.3 pg (ref 26.0–34.0)
MCHC: 33.7 g/dL (ref 30.0–36.0)
MCV: 95.9 fL (ref 80.0–100.0)
Monocytes Absolute: 0.3 10*3/uL (ref 0.1–1.0)
Monocytes Relative: 7 %
Neutro Abs: 2 10*3/uL (ref 1.7–7.7)
Neutrophils Relative %: 48 %
Platelets: 225 10*3/uL (ref 150–400)
RBC: 4.34 MIL/uL (ref 3.87–5.11)
RDW: 12.7 % (ref 11.5–15.5)
WBC: 4.1 10*3/uL (ref 4.0–10.5)
nRBC: 0 % (ref 0.0–0.2)

## 2021-10-25 LAB — URINALYSIS, ROUTINE W REFLEX MICROSCOPIC
Bilirubin Urine: NEGATIVE
Glucose, UA: NEGATIVE mg/dL
Ketones, ur: NEGATIVE mg/dL
Leukocytes,Ua: NEGATIVE
Nitrite: NEGATIVE
Protein, ur: NEGATIVE mg/dL
Specific Gravity, Urine: >=1.03 (ref 1.005–1.030)
pH: 6 (ref 5.0–8.0)

## 2021-10-25 LAB — WET PREP, GENITAL
Sperm: NONE SEEN
Trich, Wet Prep: NONE SEEN
WBC, Wet Prep HPF POC: <10 (ref <10)
Yeast Wet Prep HPF POC: NONE SEEN

## 2021-10-25 LAB — URINALYSIS, MICROSCOPIC (REFLEX)

## 2021-10-25 LAB — BASIC METABOLIC PANEL
Anion gap: 6 (ref 5–15)
BUN: 20 mg/dL (ref 6–20)
CO2: 28 mmol/L (ref 22–32)
Calcium: 9.7 mg/dL (ref 8.9–10.3)
Chloride: 103 mmol/L (ref 98–111)
Creatinine, Ser: 0.67 mg/dL (ref 0.44–1.00)
GFR, Estimated: >60 mL/min (ref >60)
Glucose, Bld: 144 mg/dL — ABNORMAL HIGH (ref 70–99)
Potassium: 3.7 mmol/L (ref 3.5–5.1)
Sodium: 137 mmol/L (ref 135–145)

## 2021-10-25 LAB — PREGNANCY, URINE: Preg Test, Ur: NEGATIVE

## 2021-10-25 MED ORDER — MORPHINE SULFATE (PF) 4 MG/ML IV SOLN
4.0000 mg | Freq: Once | INTRAVENOUS | Status: AC
  Administered 2021-10-25: 4 mg via INTRAVENOUS
  Filled 2021-10-25: qty 1

## 2021-10-25 MED ORDER — HYDROCODONE-ACETAMINOPHEN 5-325 MG PO TABS
1.0000 | ORAL_TABLET | Freq: Once | ORAL | Status: AC
  Administered 2021-10-25: 1 via ORAL
  Filled 2021-10-25: qty 1

## 2021-10-25 MED ORDER — NAPROXEN 500 MG PO TABS: 500.0000 mg | ORAL_TABLET | Freq: Two times a day (BID) | ORAL | 0 refills | Status: AC

## 2021-10-25 MED ORDER — METRONIDAZOLE 500 MG PO TABS: 500.0000 mg | ORAL_TABLET | Freq: Two times a day (BID) | ORAL | 0 refills | Status: AC

## 2021-10-25 MED ORDER — DOXYCYCLINE HYCLATE 100 MG PO CAPS
100.0000 mg | ORAL_CAPSULE | Freq: Two times a day (BID) | ORAL | 0 refills | Status: DC
Start: 2021-10-25 — End: 2022-05-06

## 2021-10-25 MED ORDER — IOHEXOL 300 MG/ML  SOLN
100.0000 mL | Freq: Once | INTRAMUSCULAR | Status: AC | PRN
  Administered 2021-10-25: 100 mL via INTRAVENOUS

## 2021-10-25 NOTE — Discharge Instructions (Addendum)
I have placed an urgent referral to the OB/GYN who you need to follow-up with.  Take the antibiotics as prescribed.  Return for new or worsening symptoms.

## 2021-10-25 NOTE — ED Notes (Signed)
U/S at bedside with female NT

## 2021-10-25 NOTE — ED Provider Notes (Signed)
[facility] EMERGENCY DEPARTMENT Provider Note   CSN: [id number] Arrival date & time: 10/25/21  1343     History  Chief Complaint  Patient presents with   Pelvic Pain   Dysuria    Sylvia Gray is a 21 y.o. female past medical history significant for recurrent chlamydia infections here for evaluation of persistent lower abdominal pain.  Patient states has been ongoing over the last 3 months.  Has been assessed by multiple providers for similar.  Had positive chlamydia test in May.  Was treated with azithromycin and Rocephin at that time.  Symptoms initially improved.  Subsequently had recurrent dysuria, suprapubic pain was seen given Macrobid for possible UTI.  Continue to have symptoms was seen by [facility] who diagnosed her with PID.  Was treated with 10 days of doxycycline, no flagyl.  Patient states symptoms initially improved for a few weeks however returned.  She is still sexually active with the same partner.  She is unsure if he has been fully treated for his infection. She is not on current medication for PID. Not followed by ObGyn. Vaginal discharge has improved however still having some pain. No fever, emesis, change in bowel movements. Started menstrual cycle today. Pain located to suprapubic area. No meds for pain PTA. Seen by [facility] 10/22/21 with SI due to concern for unsupportive partner with recurrent PID. Denies SI, HI, AVH currently  HPI     Home Medications Prior to Admission medications   Medication Sig Start Date End Date Taking? Authorizing Provider  doxycycline (VIBRAMYCIN) 100 MG capsule Take 1 capsule (100 mg total) by mouth 2 (two) times daily. 10/25/21  Yes Jerika Wales A, PA-C  metroNIDAZOLE (FLAGYL) 500 MG tablet Take 1 tablet (500 mg total) by mouth 2 (two) times daily. 10/25/21  Yes Shenoa Hattabaugh A, PA-C  naproxen (NAPROSYN) 500 MG tablet Take 1 tablet (500 mg total) by mouth 2 (two) times daily. 10/25/21  Yes  Aela Bohan A, PA-C  clotrimazole (GYNE-LOTRIMIN) 1 % vaginal cream Place 1 Applicatorful vaginally at bedtime. 10/22/21   Carlyn Reichert, MD  sertraline (ZOLOFT) 50 MG tablet Take 1 tablet (50 mg total) by mouth daily. 10/23/21   Carlyn Reichert, MD  VIENVA 0.1-20 MG-MCG tablet Take 1 tablet by mouth daily. 08/22/21   [provider]  omeprazole (PRILOSEC) 40 MG capsule Take 1 capsule (40 mg total) by mouth daily. 03/05/19 04/12/19  Dartha Lodge, PA-C  sucralfate (CARAFATE) 1 GM/10ML suspension Take 10 mLs (1 g total) by mouth 4 (four) times daily -  with meals and at bedtime. 03/05/19 04/12/19  Dartha Lodge, PA-C      Allergies    Apple juice, Kiwi extract, Strawberry extract, and Lamotrigine    Review of Systems   Review of Systems  Constitutional: Negative.   HENT: Negative.    Respiratory: Negative.    Cardiovascular: Negative.   Gastrointestinal:  Positive for abdominal pain. Negative for abdominal distention, anal bleeding, blood in stool, constipation, diarrhea, nausea, rectal pain and vomiting.  Genitourinary:  Positive for dysuria and vaginal bleeding. Negative for decreased urine volume, difficulty urinating, dyspareunia, enuresis, flank pain, frequency, genital sores, hematuria, menstrual problem, pelvic pain, urgency and vaginal pain.  Musculoskeletal: Negative.   Skin: Negative.   Neurological: Negative.   All other systems reviewed and are negative.   Physical Exam Updated Vital Signs BP 107/65   Pulse 81   Temp 97.8 F (36.6 C) (Oral)   Resp 14  Ht 5\' 3"  (1.6 m)   Wt 48.2 kg   LMP 10/19/2021 (Exact Date)   SpO2 98%   BMI 18.82 kg/m  Physical Exam Vitals and nursing note reviewed.  Constitutional:      General: She is not in acute distress.    Appearance: She is well-developed. She is not ill-appearing, toxic-appearing or diaphoretic.  HENT:     Head: Normocephalic and atraumatic.  Eyes:     Pupils: Pupils are equal, round, and reactive to  light.  Cardiovascular:     Rate and Rhythm: Normal rate.     Pulses: Normal pulses.     Heart sounds: Normal heart sounds.  Pulmonary:     Effort: Pulmonary effort is normal. No respiratory distress.     Breath sounds: Normal breath sounds.  Abdominal:     General: Bowel sounds are normal. There is no distension.     Palpations: Abdomen is soft.  Genitourinary:    Comments: Normal appearing external female genitalia without rashes or lesions, normal vaginal epithelium. Normal appearing cervix without discharge or petechiae.There is moderate bleeding noted at the os.no Odor. Bimanual: No CMT, non-tender.  No palpable adnexal masses or tenderness. Uterus midline and not fixed. Rectovaginal exam was deferred.  No cystocele or rectocele noted. No pelvic lymphadenopathy noted. Wet prep was obtained.  Cultures for gonorrhea and chlamydia collected. Exam performed with chaperone in room.   Musculoskeletal:        General: Normal range of motion.     Cervical back: Normal range of motion.  Skin:    General: Skin is warm and dry.  Neurological:     General: No focal deficit present.     Mental Status: She is alert.  Psychiatric:        Mood and Affect: Mood normal.     ED Results / Procedures / Treatments   Labs (all labs ordered are listed, but only abnormal results are displayed) Labs Reviewed  WET PREP, GENITAL - Abnormal; Notable for the following components:      Result Value   Clue Cells Wet Prep HPF POC PRESENT (*)    All other components within normal limits  URINALYSIS, ROUTINE W REFLEX MICROSCOPIC - Abnormal; Notable for the following components:   Hgb urine dipstick LARGE (*)    All other components within normal limits  BASIC METABOLIC PANEL - Abnormal; Notable for the following components:   Glucose, Bld 144 (*)    All other components within normal limits  URINALYSIS, MICROSCOPIC (REFLEX) - Abnormal; Notable for the following components:   Bacteria, UA MANY (*)    All  other components within normal limits  PREGNANCY, URINE  CBC WITH DIFFERENTIAL/PLATELET  GC/CHLAMYDIA PROBE AMP ([place]) NOT AT [facility]    EKG None  Radiology CT ABDOMEN PELVIS W CONTRAST  Result Date: 10/25/2021 CLINICAL DATA:  Worsening lower abdominal and pelvic pain for 2 months. Dysuria. Pelvic inflammatory disease. EXAM: CT ABDOMEN AND PELVIS WITH CONTRAST TECHNIQUE: Multidetector CT imaging of the abdomen and pelvis was performed using the standard protocol following bolus administration of intravenous contrast. RADIATION DOSE REDUCTION: This exam was performed according to the departmental dose-optimization program which includes automated exposure control, adjustment of the mA and/or kV according to patient size and/or use of iterative reconstruction technique. CONTRAST:  12/26/2021 OMNIPAQUE IOHEXOL 300 MG/ML  SOLN COMPARISON:  Noncontrast CT on 08/24/2021 FINDINGS: Lower Chest: No acute findings. Hepatobiliary: No hepatic masses identified. Gallbladder is unremarkable. No evidence of biliary ductal dilatation.  Pancreas:  No mass or inflammatory changes. Spleen: Within normal limits in size and appearance. Adrenals/Urinary Tract: No masses identified. No evidence of ureteral calculi or hydronephrosis. Stomach/Bowel: No evidence of obstruction, inflammatory process or abnormal fluid collections. Vascular/Lymphatic: No pathologically enlarged lymph nodes. No acute vascular findings. Reproductive:  No mass or other significant abnormality. Other:  None. Musculoskeletal:  No suspicious bone lesions identified. IMPRESSION: Negative. No acute findings or other significant abnormality. Electronically Signed   By: Danae Orleans M.D.   On: 10/25/2021 19:57   US PELVIC COMPLETE W TRANSVAGINAL AND TORSION R/O  Result Date: 10/25/2021 CLINICAL DATA:  Pelvic pain EXAM: TRANSABDOMINAL AND TRANSVAGINAL ULTRASOUND OF PELVIS DOPPLER ULTRASOUND OF OVARIES TECHNIQUE: Both transabdominal and transvaginal  ultrasound examinations of the pelvis were performed. Transabdominal technique was performed for global imaging of the pelvis including uterus, ovaries, adnexal regions, and pelvic cul-de-sac. It was necessary to proceed with endovaginal exam following the transabdominal exam to visualize the endometrium and ovaries. Color and duplex Doppler ultrasound was utilized to evaluate blood flow to the ovaries. COMPARISON:  Pelvic sonogram done on 05/29/2017 and CT done on 08/24/2021 FINDINGS: Uterus Measurements: 5.9 x 2.4 x 3.7 cm = volume: 27.8 mL. No fibroids or other mass visualized. Endometrium Thickness: 4 mm.  No focal abnormality visualized. Right ovary Measurements: 2.8 x 1.7 x 2.5 cm = volume: 6.3 mL. There is 1.8 cm follicle in the right ovary. And color Doppler examination, ectatic vessels are noted in the right adnexal region. Left ovary Measurements: 2.7 x 2 x 2.5 cm = volume: 7.1 mL. Small follicles are seen. There is vascular flow in color Doppler examination. Pulsed Doppler evaluation of both ovaries demonstrates normal low-resistance arterial and venous waveforms. Other findings No abnormal free fluid. IMPRESSION: No significant sonographic abnormalities are seen in pelvis. Electronically Signed   By: Ernie Avena M.D.   On: 10/25/2021 17:52    Procedures Procedures    Medications Ordered in ED Medications  HYDROcodone-acetaminophen (NORCO/VICODIN) 5-325 MG per tablet 1 tablet (1 tablet Oral Given 10/25/21 1558)  morphine (PF) 4 MG/ML injection 4 mg (4 mg Intravenous Given 10/25/21 1925)  iohexol (OMNIPAQUE) 300 MG/ML solution 100 mL (100 mLs Intravenous Contrast Given 10/25/21 1935)   ED Course/ Medical Decision Making/ A&P    21 year old here for evaluation of persistent pelvic pain ongoing over the last 3 months.  Was previously diagnosed with PID, treated with antibiotics.  Symptoms improved.  She still sexually active with partner who allegedly gave her chlamydia.  Per patient she  does not think he has been treated. Started menstrual cycle yesterday. States discharge has improved.  Labs and imaging personally viewed and interpreted:  CBC without leukocytosis UA many bacteria Pregnancy test negative Wet prep with BV Metabolic panel mild hyperglycemia at 144  GU exam with mild cervical motion tenderness.  Difficult to tell discharge given blood for menstrual cycle.  Swabs for GC and chlamydia obtained.  Ultrasound negative for torsion, mass, fluid collection CT abdomen pelvis without significant abnormality  Discussed results with patient.  Encourage abstaining from sexual intercourse throughout her treatment as well as urging her partner to get treated as well.  She need close follow-up with OB/GYN  Patient is nontoxic, nonseptic appearing, in no apparent distress.  Patient's pain and other symptoms adequately managed in emergency department.  Fluid bolus given.  Labs, imaging and vitals reviewed.  Patient does not meet the SIRS or Sepsis criteria.  On repeat exam patient does not have a surgical  abdomin and there are no peritoneal signs.  No indication of appendicitis, bowel obstruction, bowel perforation, cholecystitis, diverticulitis, TOA, torsion or ectopic pregnancy.  Patient discharged home with symptomatic treatment and given strict instructions for follow-up with their primary care physician.  I have also discussed reasons to return immediately to the ER.  Patient expresses understanding and agrees with plan.                            Medical Decision Making Amount and/or Complexity of Data Reviewed Independent Historian: friend External Data Reviewed: labs, radiology and notes. Labs: ordered. Decision-making details documented in ED Course. Radiology: ordered and independent interpretation performed. Decision-making details documented in ED Course.  Risk OTC drugs. Prescription drug management. Parenteral controlled substances. Decision regarding  hospitalization. Diagnosis or treatment significantly limited by social determinants of health.          Final Clinical Impression(s) / ED Diagnoses Final diagnoses:  Pelvic pain in female  BV (bacterial vaginosis)  PID (acute pelvic inflammatory disease)    Rx / DC Orders ED Discharge Orders          Ordered    Ambulatory referral to Obstetrics / Gynecology       Comments: Recurrent PID   10/25/21 2044    doxycycline (VIBRAMYCIN) 100 MG capsule  2 times daily        10/25/21 2046    metroNIDAZOLE (FLAGYL) 500 MG tablet  2 times daily        10/25/21 2046    naproxen (NAPROSYN) 500 MG tablet  2 times daily        10/25/21 2046              Letha Mirabal A, PA-C 10/25/21 2054    Cheryll Cockayne, MD 10/28/21 2322

## 2021-10-25 NOTE — ED Notes (Signed)
Assisted patient to BR.

## 2021-10-25 NOTE — ED Triage Notes (Signed)
Pt c/o pelvic pain and dysuria x 2 months. She was tx for chlamydia and PID with Doxy around 5/15. Sx have been ongoing and progressively worsening since this occurrence. Denies vaginal issues.

## 2021-10-26 LAB — GC/CHLAMYDIA PROBE AMP (~~LOC~~) NOT AT ARMC
Chlamydia: NEGATIVE
Comment: NEGATIVE
Comment: NORMAL
Neisseria Gonorrhea: NEGATIVE

## 2021-10-31 ENCOUNTER — Ambulatory Visit (HOSPITAL_COMMUNITY): Payer: Medicaid Other

## 2021-10-31 ENCOUNTER — Telehealth (HOSPITAL_COMMUNITY): Payer: Self-pay | Admitting: Licensed Clinical Social Worker

## 2021-10-31 NOTE — Telephone Encounter (Signed)
Cln sent Webex invitation at 8:43 am and signed on at 10:00 am, sent reminder link for [facility], called and left VM, and remained online for session until 10:15 am. Pt failed to sign on for session.

## 2021-11-01 ENCOUNTER — Ambulatory Visit (HOSPITAL_COMMUNITY): Payer: Medicaid Other

## 2021-11-09 ENCOUNTER — Telehealth (HOSPITAL_COMMUNITY): Payer: Self-pay | Admitting: Endocrinology

## 2021-11-09 NOTE — BH Assessment (Signed)
Care Management - Follow Up [facility] Discharges   Patient has been placed in an inpatient psychiatric hospital [facility]) on 10-18-2021.

## 2021-12-08 ENCOUNTER — Ambulatory Visit: Payer: Medicaid Other | Admitting: Nurse Practitioner

## 2021-12-27 ENCOUNTER — Encounter: Payer: Medicaid Other | Admitting: Student

## 2022-04-18 ENCOUNTER — Ambulatory Visit (INDEPENDENT_AMBULATORY_CARE_PROVIDER_SITE_OTHER): Payer: Medicaid Other | Admitting: Clinical

## 2022-04-18 ENCOUNTER — Ambulatory Visit (HOSPITAL_COMMUNITY): Payer: Medicaid Other | Admitting: Physician Assistant

## 2022-04-18 DIAGNOSIS — F332 Major depressive disorder, recurrent severe without psychotic features: Secondary | ICD-10-CM | POA: Diagnosis not present

## 2022-04-18 DIAGNOSIS — F411 Generalized anxiety disorder: Secondary | ICD-10-CM
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
## 2022-04-19 ENCOUNTER — Ambulatory Visit (INDEPENDENT_AMBULATORY_CARE_PROVIDER_SITE_OTHER): Payer: Medicaid Other | Admitting: Physician Assistant

## 2022-04-19 ENCOUNTER — Encounter (HOSPITAL_COMMUNITY): Payer: Self-pay | Admitting: Physician Assistant

## 2022-04-19 VITALS — BP 100/72 | HR 81 | Ht 63.0 in | Wt 101.0 lb

## 2022-04-19 DIAGNOSIS — F411 Generalized anxiety disorder: Secondary | ICD-10-CM | POA: Diagnosis not present

## 2022-04-19 DIAGNOSIS — F332 Major depressive disorder, recurrent severe without psychotic features: Secondary | ICD-10-CM

## 2022-04-19 DIAGNOSIS — G479 Sleep disorder, unspecified: Secondary | ICD-10-CM

## 2022-04-19 MED ORDER — TRAZODONE HCL 50 MG PO TABS: 50.0000 mg | ORAL_TABLET | Freq: Every day | ORAL | 1 refills | Status: DC

## 2022-04-19 MED ORDER — HYDROXYZINE HCL 10 MG PO TABS: 10.0000 mg | ORAL_TABLET | Freq: Three times a day (TID) | ORAL | 1 refills | Status: DC | PRN

## 2022-04-19 MED ORDER — SERTRALINE HCL 100 MG PO TABS: 100.0000 mg | ORAL_TABLET | Freq: Every day | ORAL | 1 refills | Status: DC

## 2022-04-19 MED ORDER — SERTRALINE HCL 50 MG PO TABS: ORAL_TABLET | ORAL | 0 refills | Status: DC
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
## 2022-05-06 ENCOUNTER — Other Ambulatory Visit: Payer: Self-pay

## 2022-05-06 ENCOUNTER — Emergency Department (HOSPITAL_COMMUNITY): Payer: Medicaid Other

## 2022-05-06 ENCOUNTER — Emergency Department (HOSPITAL_COMMUNITY)
Admission: EM | Admit: 2022-05-06 | Discharge: 2022-05-06 | Disposition: A | Discharge status: Home / Self Care | Payer: Medicaid Other | Attending: Emergency Medicine | Admitting: Emergency Medicine

## 2022-05-06 DIAGNOSIS — R1084 Generalized abdominal pain: Secondary | ICD-10-CM

## 2022-05-06 DIAGNOSIS — R112 Nausea with vomiting, unspecified: Secondary | ICD-10-CM

## 2022-05-06 DIAGNOSIS — Z20822 Contact with and (suspected) exposure to covid-19: Secondary | ICD-10-CM | POA: Insufficient documentation

## 2022-05-06 DIAGNOSIS — K529 Noninfective gastroenteritis and colitis, unspecified: Secondary | ICD-10-CM | POA: Diagnosis not present

## 2022-05-06 LAB — RESP PANEL BY RT-PCR (RSV, FLU A&B, COVID)  RVPGX2
Influenza A by PCR: NEGATIVE
Influenza B by PCR: NEGATIVE
Resp Syncytial Virus by PCR: NEGATIVE
SARS Coronavirus 2 by RT PCR: NEGATIVE

## 2022-05-06 LAB — URINALYSIS, ROUTINE W REFLEX MICROSCOPIC
Bilirubin Urine: NEGATIVE
Glucose, UA: 50 mg/dL — AB
Ketones, ur: NEGATIVE mg/dL
Nitrite: NEGATIVE
Protein, ur: NEGATIVE mg/dL
Specific Gravity, Urine: 1.016 (ref 1.005–1.030)
pH: 8 (ref 5.0–8.0)

## 2022-05-06 LAB — CBC WITH DIFFERENTIAL/PLATELET
Abs Immature Granulocytes: 0.02 10*3/uL (ref 0.00–0.07)
Basophils Absolute: 0.1 10*3/uL (ref 0.0–0.1)
Basophils Relative: 1 %
Eosinophils Absolute: 0 10*3/uL (ref 0.0–0.5)
Eosinophils Relative: 0 %
HCT: 39 % (ref 36.0–46.0)
Hemoglobin: 13.4 g/dL (ref 12.0–15.0)
Immature Granulocytes: 0 %
Lymphocytes Relative: 32 %
Lymphs Abs: 1.5 10*3/uL (ref 0.7–4.0)
MCH: 32.4 pg (ref 26.0–34.0)
MCHC: 34.4 g/dL (ref 30.0–36.0)
MCV: 94.4 fL (ref 80.0–100.0)
Monocytes Absolute: 0.2 10*3/uL (ref 0.1–1.0)
Monocytes Relative: 4 %
Neutro Abs: 3.1 10*3/uL (ref 1.7–7.7)
Neutrophils Relative %: 63 %
Platelets: 275 10*3/uL (ref 150–400)
RBC: 4.13 MIL/uL (ref 3.87–5.11)
RDW: 13.1 % (ref 11.5–15.5)
WBC: 4.9 10*3/uL (ref 4.0–10.5)
nRBC: 0 % (ref 0.0–0.2)

## 2022-05-06 LAB — COMPREHENSIVE METABOLIC PANEL
ALT: 23 U/L (ref 0–44)
AST: 35 U/L (ref 15–41)
Albumin: 4.3 g/dL (ref 3.5–5.0)
Alkaline Phosphatase: 53 U/L (ref 38–126)
Anion gap: 12 (ref 5–15)
BUN: 14 mg/dL (ref 6–20)
CO2: 21 mmol/L — ABNORMAL LOW (ref 22–32)
Calcium: 9 mg/dL (ref 8.9–10.3)
Chloride: 105 mmol/L (ref 98–111)
Creatinine, Ser: 0.68 mg/dL (ref 0.44–1.00)
GFR, Estimated: >60 mL/min (ref >60)
Glucose, Bld: 165 mg/dL — ABNORMAL HIGH (ref 70–99)
Potassium: 4.3 mmol/L (ref 3.5–5.1)
Sodium: 138 mmol/L (ref 135–145)
Total Bilirubin: 0.5 mg/dL (ref 0.3–1.2)
Total Protein: 7.3 g/dL (ref 6.5–8.1)

## 2022-05-06 LAB — LIPASE, BLOOD: Lipase: 36 U/L (ref 11–51)

## 2022-05-06 LAB — I-STAT BETA HCG BLOOD, ED (MC, WL, AP ONLY): I-stat hCG, quantitative: <5 m[IU]/mL (ref <5)

## 2022-05-06 MED ORDER — SODIUM CHLORIDE 0.9 % IV BOLUS
1000.0000 mL | Freq: Once | INTRAVENOUS | Status: AC
  Administered 2022-05-06: 1000 mL via INTRAVENOUS

## 2022-05-06 MED ORDER — ONDANSETRON HCL 4 MG/2ML IJ SOLN
4.0000 mg | Freq: Once | INTRAMUSCULAR | Status: AC
  Administered 2022-05-06: 4 mg via INTRAVENOUS
  Filled 2022-05-06: qty 2

## 2022-05-06 MED ORDER — SODIUM CHLORIDE 0.9 % IV SOLN
25.0000 mg | Freq: Once | INTRAVENOUS | Status: AC
  Administered 2022-05-06: 25 mg via INTRAVENOUS
  Filled 2022-05-06: qty 25

## 2022-05-06 MED ORDER — IOHEXOL 300 MG/ML  SOLN
100.0000 mL | Freq: Once | INTRAMUSCULAR | Status: AC | PRN
  Administered 2022-05-06: 100 mL via INTRAVENOUS

## 2022-05-06 MED ORDER — AMOXICILLIN-POT CLAVULANATE 875-125 MG PO TABS: 1.0000 | ORAL_TABLET | Freq: Two times a day (BID) | ORAL | 0 refills | Status: AC

## 2022-05-06 MED ORDER — DROPERIDOL 2.5 MG/ML IJ SOLN
1.2500 mg | Freq: Once | INTRAMUSCULAR | Status: AC
  Administered 2022-05-06: 1.25 mg via INTRAVENOUS
  Filled 2022-05-06: qty 2

## 2022-05-06 MED ORDER — ONDANSETRON 4 MG PO TBDP: 4.0000 mg | ORAL_TABLET | Freq: Three times a day (TID) | ORAL | 0 refills | Status: AC | PRN

## 2022-05-06 NOTE — ED Triage Notes (Signed)
[facility] bought patient from home. Pt has been having NVD since last night. This morning is generalized abdominal pain and nausea.  BP 118/84 HR 80 100%RA  CBG 164

## 2022-05-06 NOTE — Discharge Instructions (Signed)
As we discussed, your workup in the ER today was reassuring for acute findings.  CT imaging did reveal some inflammation in the wall of your intestinal tract.  Given this I have prescribed you an antibiotic Augmentin for you to take as prescribed in its entirety for management of this diagnosis.  I have also given you a prescription for Zofran which is an antinausea medication for you to take as prescribed as needed.  Follow-up with your primary care doctor in the next few days for continued evaluation and management.  Return if development of any new or worsening symptoms.

## 2022-05-06 NOTE — ED Provider Notes (Signed)
[facility] Provider Note   CSN: [id number] Arrival date & time: 05/06/22  6503     History  Chief Complaint  Patient presents with   Nausea    Sylvia Gray is a 22 y.o. female.  Patient with no pertinent past medical history presents today with complaints of nausea, vomiting, diarrhea, and abdominal pain. She states that same began last night after she took a few shots of liquor. States she has also been smoking marijuana. Denies any history of similar symptoms previously. No known sick contacts. Endorses pain throughout her abdomen that does not radiate. Denies history of abdominal surgeries. Denies fevers or chills, hematuria, or dysuria. Denies hematochezia or melena.  The history is provided by the patient. No language interpreter was used.       Home Medications Prior to Admission medications   Medication Sig Start Date End Date Taking? Authorizing Provider  clotrimazole (GYNE-LOTRIMIN) 1 % vaginal cream Place 1 Applicatorful vaginally at bedtime. 10/22/21   Corky Sox, MD  doxycycline (VIBRAMYCIN) 100 MG capsule Take 1 capsule (100 mg total) by mouth 2 (two) times daily. 10/25/21   Henderly, Britni A, PA-C  hydrOXYzine (ATARAX) 10 MG tablet Take 1 tablet (10 mg total) by mouth 3 (three) times daily as needed. 04/19/22   Nwoko, Terese Door, PA  metroNIDAZOLE (FLAGYL) 500 MG tablet Take 1 tablet (500 mg total) by mouth 2 (two) times daily. 10/25/21   Henderly, Britni A, PA-C  naproxen (NAPROSYN) 500 MG tablet Take 1 tablet (500 mg total) by mouth 2 (two) times daily. 10/25/21   Henderly, Britni A, PA-C  sertraline (ZOLOFT) 100 MG tablet Take 1 tablet (100 mg total) by mouth daily. Patient to pick up Sertraline 100 mg tablets once she has completed her 50 mg tablets. 04/19/22 04/19/23  Nwoko, Terese Door, PA  sertraline (ZOLOFT) 50 MG tablet Take 1 tablet (50 mg total) by mouth daily. 10/23/21   Corky Sox, MD  sertraline  (ZOLOFT) 50 MG tablet Take 1 tablet (50 mg total) by mouth daily for 4 days, THEN 2 tablets (100 mg total) daily. 04/19/22 04/23/23  Nwoko, Terese Door, PA  traZODone (DESYREL) 50 MG tablet Take 1 tablet (50 mg total) by mouth at bedtime. 04/19/22   Nwoko, Uchenna E, PA  VIENVA 0.1-20 MG-MCG tablet Take 1 tablet by mouth daily. 08/22/21   [provider]  omeprazole (PRILOSEC) 40 MG capsule Take 1 capsule (40 mg total) by mouth daily. 03/05/19 04/12/19  Jacqlyn Larsen, PA-C  sucralfate (CARAFATE) 1 GM/10ML suspension Take 10 mLs (1 g total) by mouth 4 (four) times daily -  with meals and at bedtime. 03/05/19 04/12/19  Jacqlyn Larsen, PA-C      Allergies    Apple juice, Kiwi extract, Strawberry extract, and Lamotrigine    Review of Systems   Review of Systems  Gastrointestinal:  Positive for abdominal pain, diarrhea, nausea and vomiting.  All other systems reviewed and are negative.   Physical Exam Updated Vital Signs BP 114/84 (BP Location: Left Arm)   Pulse 67   Temp 97.7 F (36.5 C) (Rectal)   Resp (!) 22   SpO2 100%  Physical Exam Vitals and nursing note reviewed.  Constitutional:      General: She is not in acute distress.    Appearance: Normal appearance. She is normal weight. She is not ill-appearing, toxic-appearing or diaphoretic.  HENT:     Head: Normocephalic and atraumatic.  Cardiovascular:  Rate and Rhythm: Normal rate.  Pulmonary:     Effort: Pulmonary effort is normal. No respiratory distress.  Abdominal:     General: Abdomen is flat.     Palpations: Abdomen is soft.     Tenderness: There is abdominal tenderness.  Musculoskeletal:        General: Normal range of motion.     Cervical back: Normal range of motion.  Skin:    General: Skin is warm and dry.  Neurological:     General: No focal deficit present.     Mental Status: She is alert.  Psychiatric:        Mood and Affect: Mood normal.        Behavior: Behavior normal.     ED Results / Procedures  / Treatments   Labs (all labs ordered are listed, but only abnormal results are displayed) Labs Reviewed  COMPREHENSIVE METABOLIC PANEL - Abnormal; Notable for the following components:      Result Value   CO2 21 (*)    Glucose, Bld 165 (*)    All other components within normal limits  URINALYSIS, ROUTINE W REFLEX MICROSCOPIC - Abnormal; Notable for the following components:   APPearance HAZY (*)    Glucose, UA 50 (*)    Hgb urine dipstick SMALL (*)    Leukocytes,Ua TRACE (*)    All other components within normal limits  RESP PANEL BY RT-PCR (RSV, FLU A&B, COVID)  RVPGX2  LIPASE, BLOOD  CBC WITH DIFFERENTIAL/PLATELET  I-STAT BETA HCG BLOOD, ED ([facility], [facility], [facility] ONLY)    EKG None  Radiology CT ABDOMEN PELVIS W CONTRAST  Result Date: 05/06/2022 CLINICAL DATA:  Abdominal pain and vomiting. EXAM: CT ABDOMEN AND PELVIS WITH CONTRAST TECHNIQUE: Multidetector CT imaging of the abdomen and pelvis was performed using the standard protocol following bolus administration of intravenous contrast. RADIATION DOSE REDUCTION: This exam was performed according to the departmental dose-optimization program which includes automated exposure control, adjustment of the mA and/or kV according to patient size and/or use of iterative reconstruction technique. CONTRAST:  OMNIPAQUE IOHEXOL 300 MG/ML  SOLN COMPARISON:  CT abdomen pelvis 10/25/2021 FINDINGS: Lower chest: Normal heart size. Lung bases are clear. No pleural effusion. Hepatobiliary: Liver is normal in size and contour. Fatty deposition adjacent to the falciform ligament. Gallbladder is unremarkable. Pancreas: Unremarkable Spleen: Unremarkable Adrenals/Urinary Tract: Normal adrenal glands. Kidneys enhance symmetrically with contrast. No hydronephrosis. Urinary bladder is unremarkable. Stomach/Bowel: The ascending and transverse colon are decompressed, limiting evaluation. Can not exclude mild wall thickening of the proximal colon. No significant  surrounding fat stranding. No evidence for small bowel obstruction. Normal morphology of the stomach. No free fluid or free intraperitoneal air. Vascular/Lymphatic: Normal caliber abdominal aorta. No retroperitoneal lymphadenopathy. Reproductive: Uterus and adnexal structures are unremarkable. Other: None Musculoskeletal: No aggressive or acute appearing osseous lesions. IMPRESSION: The ascending and transverse colon are decompressed, limiting evaluation. Can not exclude mild wall thickening of the proximal colon. Findings may be secondary to colitis in the appropriate clinical setting. Otherwise no acute process in the abdomen or pelvis. Electronically Signed   By: Annia Belt M.D.   On: 05/06/2022 14:47    Procedures Procedures    Medications Ordered in ED Medications  sodium chloride 0.9 % bolus 1,000 mL (0 mLs Intravenous Stopped 05/06/22 1041)  ondansetron (ZOFRAN) injection 4 mg (4 mg Intravenous Given 05/06/22 0954)  promethazine (PHENERGAN) 25 mg in sodium chloride 0.9 % 50 mL IVPB (0 mg Intravenous Stopped 05/06/22 1149)  iohexol (OMNIPAQUE) 300 MG/ML solution 100 mL (100 mLs Intravenous Contrast Given 05/06/22 1417)  droperidol (INAPSINE) 2.5 MG/ML injection 1.25 mg (1.25 mg Intravenous Given 05/06/22 1502)    ED Course/ Medical Decision Making/ A&P                             Medical Decision Making Amount and/or Complexity of Data Reviewed Labs: ordered. Radiology: ordered.  Risk Prescription drug management.   This patient is a 22 y.o. female who presents to the ED for concern of nausea, vomiting, diarrhea, and abdominal pain, this involves an extensive number of treatment options, and is a complaint that carries with it a high risk of complications and morbidity. The emergent differential diagnosis prior to evaluation includes, but is not limited to,  AAA, gastroenteritis, appendicitis, Bowel obstruction, Bowel perforation. Gastroparesis, DKA, Hernia, Inflammatory bowel disease,  mesenteric ischemia, pancreatitis, peritonitis SBP, volvulus.   This is not an exhaustive differential.    Physical Exam: Physical exam performed. The pertinent findings include: patient actively vomiting, tenderness throughout the abdomen  Lab Tests: I ordered, and personally interpreted labs.  The pertinent results include:  glucose 165, bicarb 21. No other acute laboratory findings    Imaging Studies: I ordered imaging studies including CT abdomen pelvis with contrast. I independently visualized and interpreted imaging which showed   The ascending and transverse colon are decompressed, limiting evaluation. Can not exclude mild wall thickening of the proximal colon. Findings may be secondary to colitis in the appropriate clinical setting.   Otherwise no acute process in the abdomen or pelvis.  I agree with the radiologist interpretation.   Medications: I ordered medication including zofran, phenergan, and droperidol  for nausea and vomiting. Reevaluation of the patient after these medicines showed that the patient resolved. I have reviewed the patients home medicines and have made adjustments as needed.  Disposition:  Patient is nontoxic, nonseptic appearing, in no apparent distress.  Patient's pain and other symptoms adequately managed in emergency department.  Fluid bolus given.  Labs, imaging and vitals reviewed.  Patient does not meet the SIRS or Sepsis criteria.  On repeat exam patient does not have a surgical abdomin and there are no peritoneal signs.  No indication of appendicitis, bowel obstruction, bowel perforation, cholecystitis, diverticulitis, PID or ectopic pregnancy. CT imaging does reveal some concern for colitis, will treat for same with Augmentin. Suspect patients recent alcohol and marijuana use is contributing as well. After medication she is able to eat and drink without any subsequent episodes of nausea or vomiting. She would like to go home. Will send with Zofran  as well. Patient given strict instructions for follow-up with their primary care physician.  I have also discussed reasons to return immediately to the ER.  Patient expresses understanding and agrees with plan.  Patient discharged in stable condition.   Final Clinical Impression(s) / ED Diagnoses Final diagnoses:  Nausea vomiting and diarrhea  Colitis  Generalized abdominal pain    Rx / DC Orders ED Discharge Orders          Ordered    ondansetron (ZOFRAN-ODT) 4 MG disintegrating tablet  Every 8 hours PRN        05/06/22 1547    amoxicillin-clavulanate (AUGMENTIN) 875-125 MG tablet  Every 12 hours        05/06/22 1547          An After Visit Summary was printed and given to the patient.  Nestor Lewandowsky 05/06/22 1549    Lacretia Leigh, MD 05/07/22 1325

## 2022-05-29 IMAGING — CT CT RENAL STONE PROTOCOL
2 of 4 series · 17 of 46 positions shown, 19 images · non-contrast
Comparison: CT 03/05/2019

CLINICAL DATA: Flank pain



[Series 2: axial st · axial · 0.78mm/px · z∈[+628,+1018]mm · 14 of 86 slices shown, 16 images]
[im 4/86  soft-tissue]
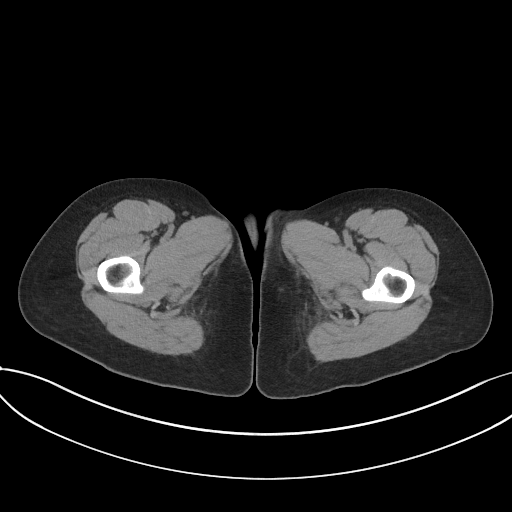
[im 4/86  bone]
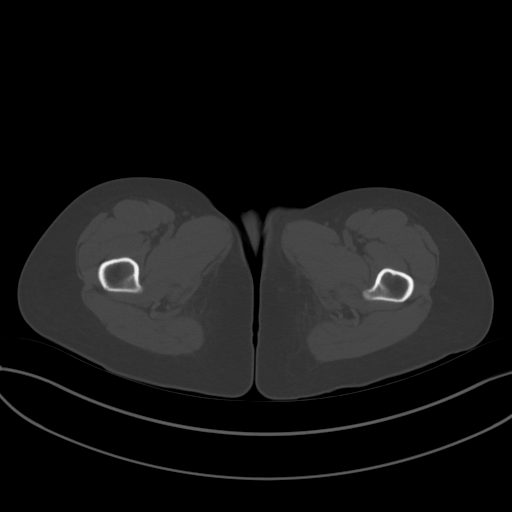
[im 11/86  soft-tissue]
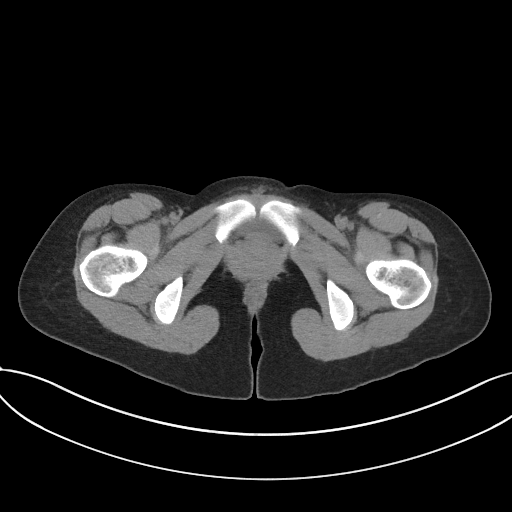
[im 18/86  soft-tissue]
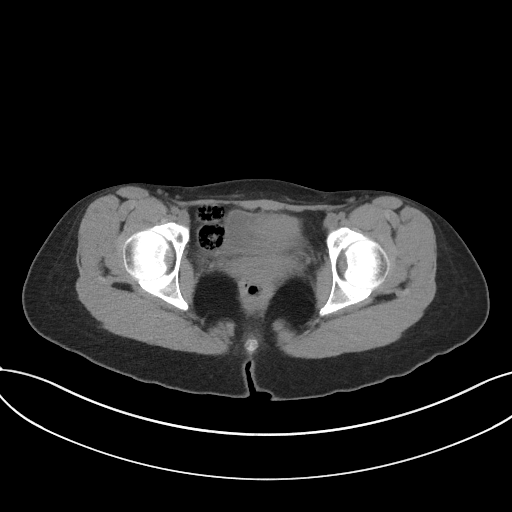
[im 22/86  soft-tissue]
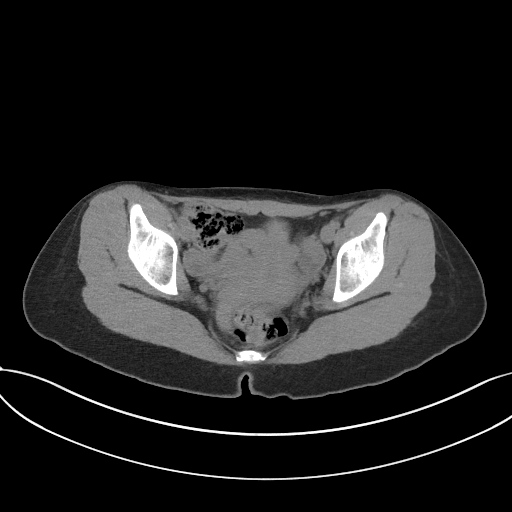
[im 29/86  soft-tissue]
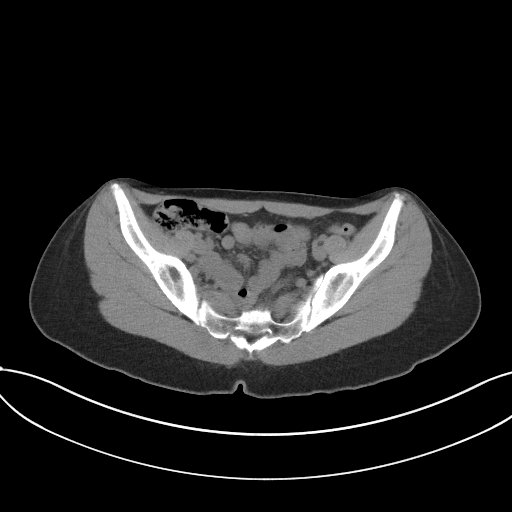
[im 36/86  soft-tissue]
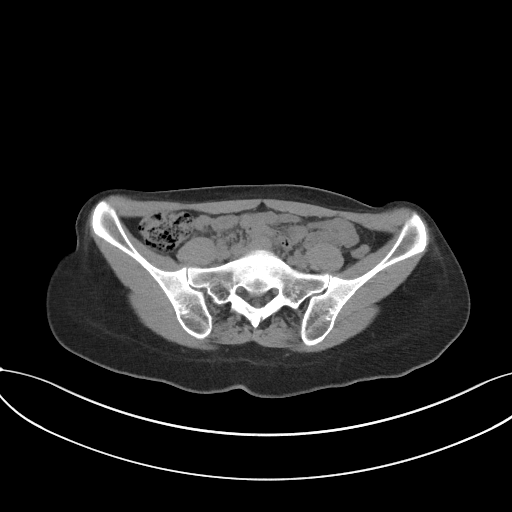
[im 39/86  soft-tissue]
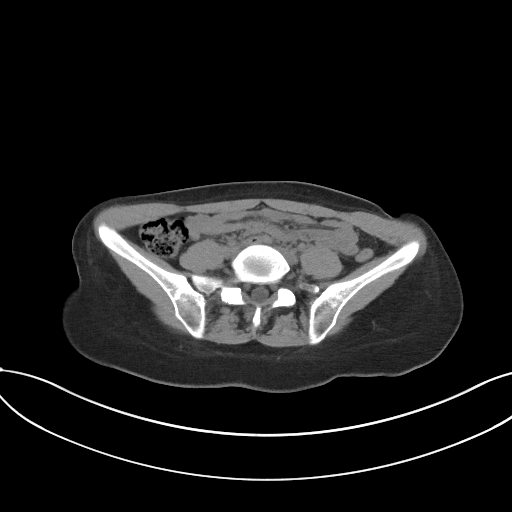
[im 47/86  soft-tissue]
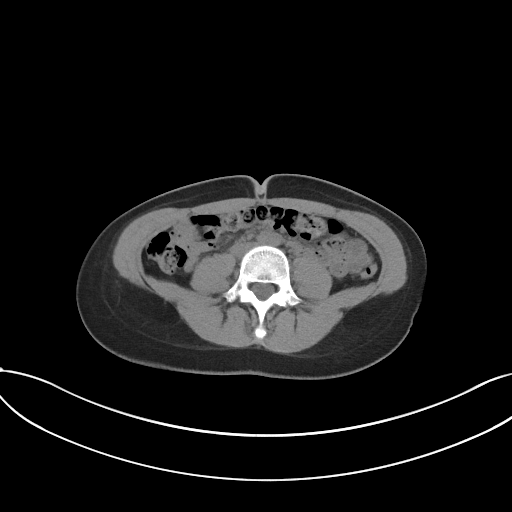
[im 50/86  soft-tissue]
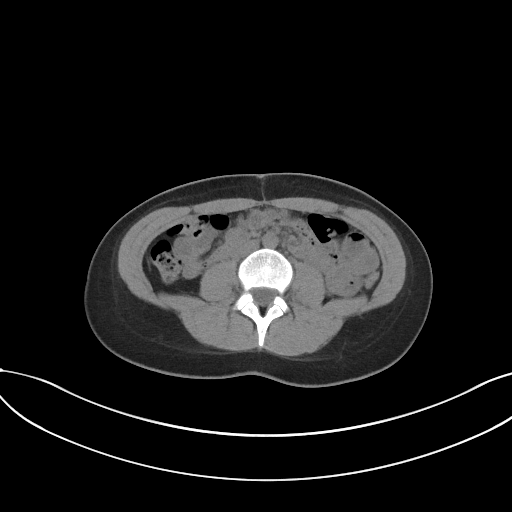
[im 50/86  bone]
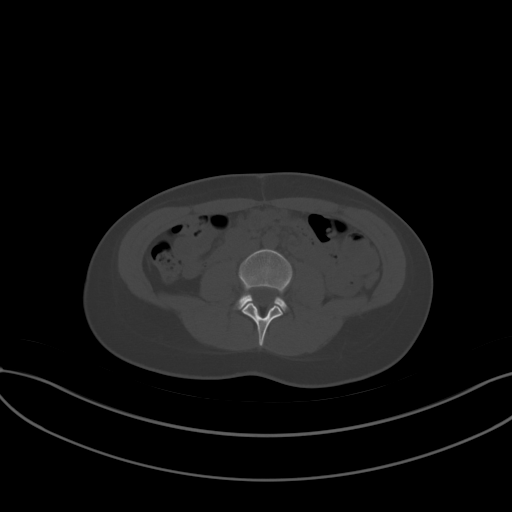
[im 57/86  soft-tissue]
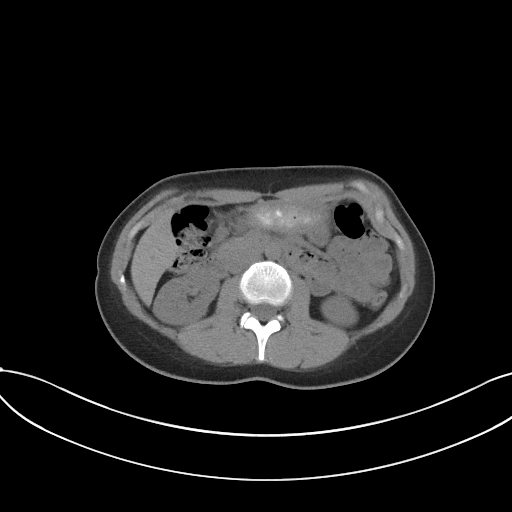
[im 64/86  soft-tissue]
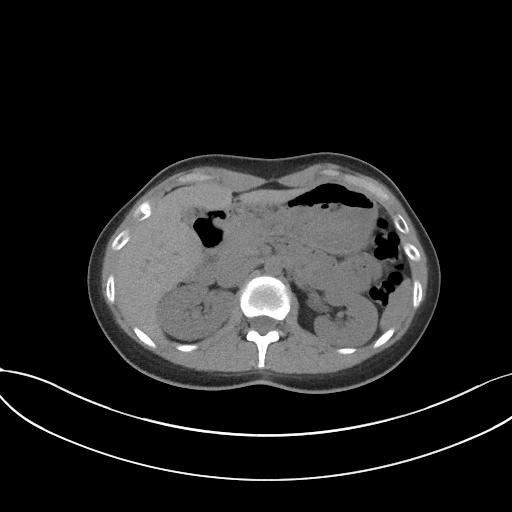
[im 68/86  soft-tissue]
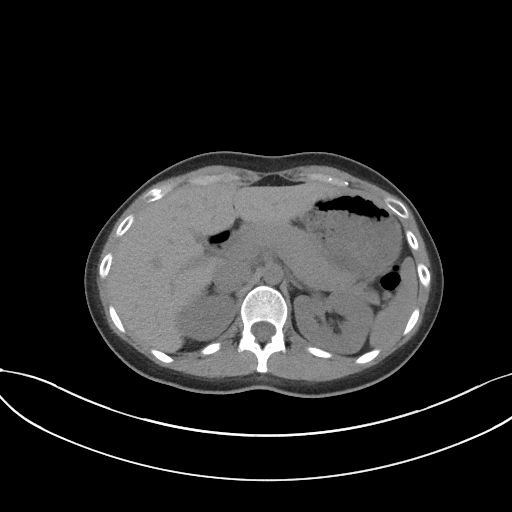
[im 75/86  soft-tissue]
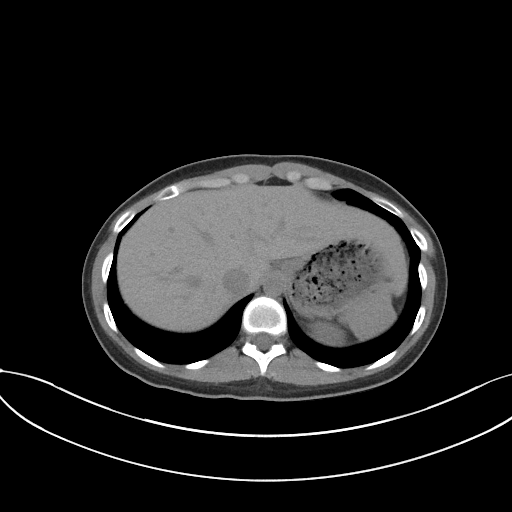
[im 82/86  soft-tissue]
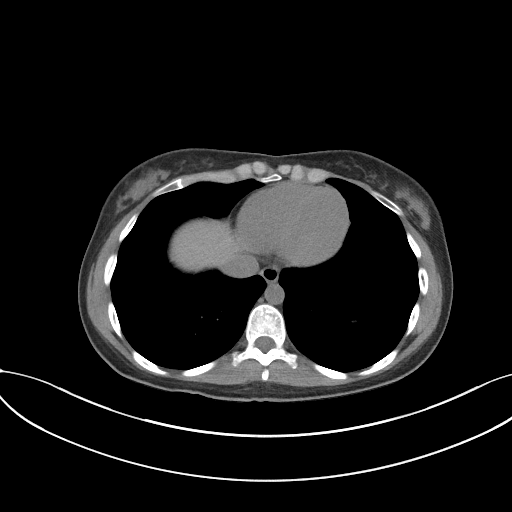

[Series 5: coronal st · coronal · 0.77mm/px · 3 of 72 slices shown]
[im 24/72  soft-tissue]
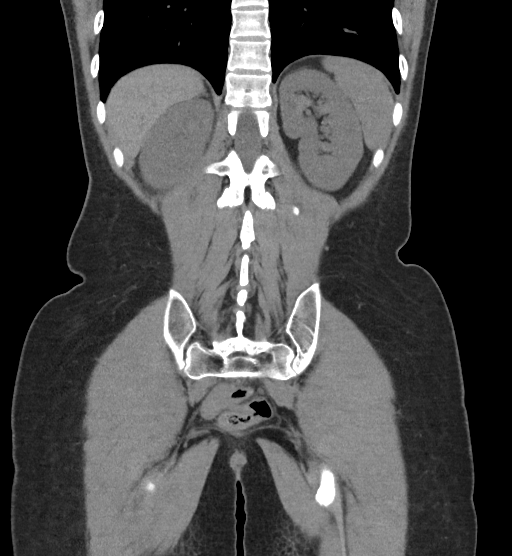
[im 32/72  soft-tissue]
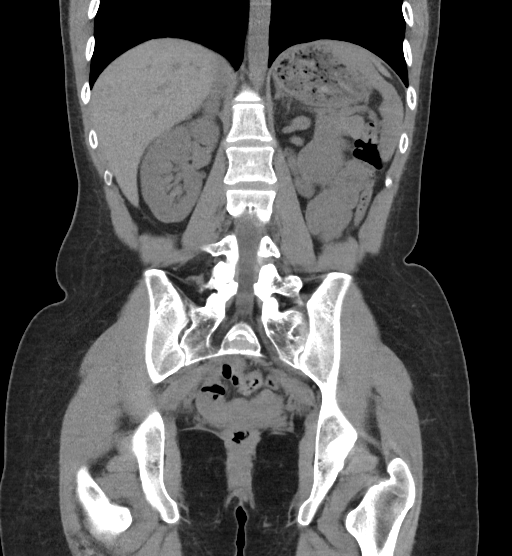
[im 40/72  soft-tissue]
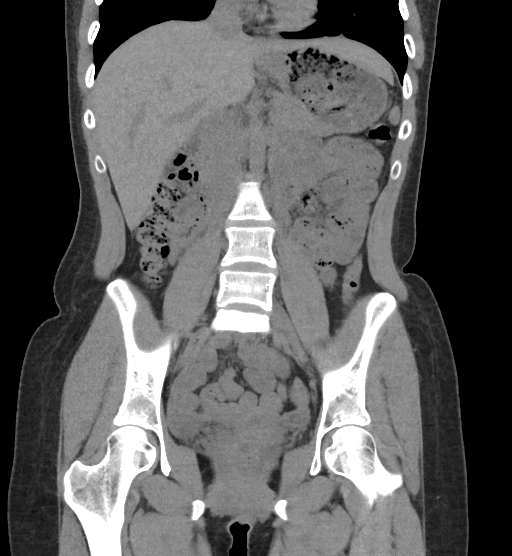

[17 of 46 positions shown; findings below may reference images not displayed]

FINDINGS: Lower chest: No acute abnormality.

Hepatobiliary: No focal liver abnormality is seen. No gallstones,
gallbladder wall thickening, or biliary dilatation.

Pancreas: Unremarkable. No pancreatic ductal dilatation or
surrounding inflammatory changes.

Spleen: Normal in size without focal abnormality.

Adrenals/Urinary Tract: Adrenal glands are unremarkable. Kidneys are
normal, without renal calculi, focal lesion, or hydronephrosis.
Bladder is unremarkable.

Stomach/Bowel: Stomach is within normal limits. Appendix appears
normal. No evidence of bowel wall thickening, distention, or
inflammatory changes.

Vascular/Lymphatic: No significant vascular findings are present. No
enlarged abdominal or pelvic lymph nodes.

Reproductive: Uterus and bilateral adnexa are unremarkable.

Other: No abdominal wall hernia or abnormality. No abdominopelvic
ascites.

Musculoskeletal: No acute or significant osseous findings.
IMPRESSION: Negative. No CT evidence for acute intra-abdominal or pelvic
abnormality

## 2022-05-31 ENCOUNTER — Encounter (HOSPITAL_COMMUNITY): Payer: Self-pay | Admitting: Physician Assistant

## 2022-05-31 ENCOUNTER — Ambulatory Visit (INDEPENDENT_AMBULATORY_CARE_PROVIDER_SITE_OTHER): Payer: Medicaid Other | Admitting: Physician Assistant

## 2022-05-31 DIAGNOSIS — G479 Sleep disorder, unspecified: Secondary | ICD-10-CM

## 2022-05-31 DIAGNOSIS — F411 Generalized anxiety disorder: Secondary | ICD-10-CM

## 2022-05-31 DIAGNOSIS — F332 Major depressive disorder, recurrent severe without psychotic features: Secondary | ICD-10-CM

## 2022-05-31 MED ORDER — HYDROXYZINE HCL 10 MG PO TABS: 10.0000 mg | ORAL_TABLET | Freq: Three times a day (TID) | ORAL | 1 refills | Status: DC | PRN

## 2022-05-31 MED ORDER — SERTRALINE HCL 50 MG PO TABS: 150.0000 mg | ORAL_TABLET | Freq: Every day | ORAL | 1 refills | Status: DC

## 2022-05-31 NOTE — Progress Notes (Signed)
[facility] MD/PA/NP OP Progress Note  05/31/2022 12:11 PM Sylvia Gray  MRN:  [id number]  Chief Complaint:  Chief Complaint  Patient presents with   Follow-up   Medication Management   HPI:   Soma Gentle. Sylvia Gray is a 22 year old, Hispanic female with a past psychiatric history significant for borderline personality disorder, sleep disturbances, generalized anxiety disorder, and major depressive disorder who presents to [facility] for follow-up and medication management.  Patient is currently being managed on the following psychiatric medications:  Sertraline 100 mg daily Hydroxyzine 10 mg 3 times daily as needed Trazodone 50 mg at bedtime  Patient believes that her Zoloft has been helpful in managing her depressive symptoms but believes that she may need to be on a higher dose.  Patient believes that her medications wearing off too quickly and has been experiencing some depressive symptoms.  Patient endorses depressive episodes 4 times a week with her symptoms lasting the whole day.  Patient's depressive episodes are characterized by the following symptoms irritability, self-isolation, crying spells, and quietness.  Patient reports that her anxiety has gotten better and has been manageable.  Patient rates her anxiety at 4 out of 10 and denies any new stressors at this time.  A PHQ-9 screen was performed with the patient scoring an 18.  A GAD-7 screen was also performed with the patient scored a 14.  Patient is alert and oriented x 4, calm, cooperative, and fully engaged in conversation during the encounter.  Patient endorses calm mood.  Patient denies suicidal or homicidal ideations.  She further denies auditory or visual hallucinations and does not appear to be responding to internal/external stimuli.  Patient endorses good sleep and receives on average 8 to 9 hours of sleep each night.  Patient endorses fair appetite and eats on average  1-2 meals per day.  Patient denies alcohol consumption and tobacco use.  Patient endorses illicit drug use in the form of marijuana.  Visit Diagnosis:    ICD-10-CM   1. Sleep disturbances  G47.9     2. GAD (generalized anxiety disorder)  F41.1 hydrOXYzine (ATARAX) 10 MG tablet    sertraline (ZOLOFT) 50 MG tablet    3. Severe episode of recurrent major depressive disorder, without psychotic features ([name])  F33.2 sertraline (ZOLOFT) 50 MG tablet      Past Psychiatric History:  Borderline personality disorder Bipolar disorder Anxiety Depression  Past Medical History:  Past Medical History:  Diagnosis Date   Anxiety    Bipolar 1 disorder ([name])    Borderline personality disorder ([name])    on Zoloft   Depression    Medical history non-contributory     Past Surgical History:  Procedure Laterality Date   NO PAST SURGERIES     WISDOM TOOTH EXTRACTION      Family Psychiatric History:  Patient states that her cousins on her father's side of the family suffer from depression. Patient believes that her cousins are not taking any medications at this time. Patient denies history of suicide attempt within her family. Patient denies history of homicide attempts within her family. Patient further denies substance abuse in her family.   Family History: History reviewed. No pertinent family history.  Social History:  Social History   Socioeconomic History   Marital status: Single    Spouse name: Not on file   Number of children: Not on file   Years of education: Not on file   Highest education level: Not on file  Occupational History   Not on file  Tobacco Use   Smoking status: Never   Smokeless tobacco: Never  Vaping Use   Vaping Use: Every day  Substance and Sexual Activity   Alcohol use: Yes    Comment: occ   Drug use: Never   Sexual activity: Yes    Birth control/protection: Pill  Other Topics Concern   Not on file  Social History Narrative   Not on file   Social  Determinants of Health   Financial Resource Strain: Not on file  Food Insecurity: Not on file  Transportation Needs: Not on file  Physical Activity: Not on file  Stress: Not on file  Social Connections: Not on file    Allergies:  Allergies  Allergen Reactions   Apple Juice Swelling   Kiwi Extract Swelling   Strawberry Extract Swelling   Lamotrigine Rash    Urticaria started 2 weeks after starting lamotrigine    Metabolic Disorder Labs: Lab Results  Component Value Date   HGBA1C 4.6 (L) 10/18/2021   MPG 85.32 10/18/2021   No results found for: "PROLACTIN" Lab Results  Component Value Date   CHOL 217 (H) 10/18/2021   TRIG 52 10/18/2021   HDL 56 10/18/2021   CHOLHDL 3.9 10/18/2021   VLDL 10 10/18/2021   LDLCALC 151 (H) 10/18/2021   Lab Results  Component Value Date   TSH 0.319 (L) 10/18/2021    Therapeutic Level Labs: No results found for: "LITHIUM" No results found for: "VALPROATE" No results found for: "CBMZ"  Current Medications: Current Outpatient Medications  Medication Sig Dispense Refill   amoxicillin-clavulanate (AUGMENTIN) 875-125 MG tablet Take 1 tablet by mouth every 12 (twelve) hours. 14 tablet 0   clotrimazole (GYNE-LOTRIMIN) 1 % vaginal cream Place 1 Applicatorful vaginally at bedtime. 45 g 0   hydrOXYzine (ATARAX) 10 MG tablet Take 1 tablet (10 mg total) by mouth 3 (three) times daily as needed. 75 tablet 1   metroNIDAZOLE (FLAGYL) 500 MG tablet Take 1 tablet (500 mg total) by mouth 2 (two) times daily. 14 tablet 0   naproxen (NAPROSYN) 500 MG tablet Take 1 tablet (500 mg total) by mouth 2 (two) times daily. 30 tablet 0   ondansetron (ZOFRAN-ODT) 4 MG disintegrating tablet Take 1 tablet (4 mg total) by mouth every 8 (eight) hours as needed for nausea or vomiting. 20 tablet 0   sertraline (ZOLOFT) 50 MG tablet Take 3 tablets (150 mg total) by mouth daily. 90 tablet 1   traZODone (DESYREL) 50 MG tablet Take 1 tablet (50 mg total) by mouth at bedtime.  30 tablet 1   VIENVA 0.1-20 MG-MCG tablet Take 1 tablet by mouth daily.     No current facility-administered medications for this visit.     Musculoskeletal: Strength & Muscle Tone: within normal limits Gait & Station: normal Patient leans: N/A  Psychiatric Specialty Exam: Review of Systems  Psychiatric/Behavioral:  Negative for decreased concentration, dysphoric mood, hallucinations, self-injury, sleep disturbance and suicidal ideas. The patient is nervous/anxious. The patient is not hyperactive.     Blood pressure 94/64, pulse 71, height 5' 3"$  (1.6 m), weight 104 lb (47.2 kg).Body mass index is 18.42 kg/m.  General Appearance: Casual  Eye Contact:  Good  Speech:  Clear and Coherent and Normal Rate  Volume:  Normal  Mood:  Anxious and Depressed  Affect:  Appropriate  Thought Process:  Coherent, Goal Directed, and Descriptions of Associations: Intact  Orientation:  Full (Time, Place, and Person)  Thought Content:  WDL and Logical   Suicidal Thoughts:  No  Homicidal Thoughts:  No  Memory:  Immediate;   Good Recent;   Good Remote;   Good  Judgement:  Good  Insight:  Good  Psychomotor Activity:  Normal  Concentration:  Concentration: Good and Attention Span: Good  Recall:  Good  Fund of Knowledge: Good  Language: Good  Akathisia:  No  Handed:  Right  AIMS (if indicated): not done  Assets:  Communication Skills Desire for Improvement Financial Resources/Insurance Housing Social Support Transportation Vocational/Educational  ADL's:  Intact  Cognition: WNL  Sleep:  Good   Screenings: AUDIT    Flowsheet Row Admission (Discharged) from 10/19/2021 in [facility]  Alcohol Use Disorder Identification Test Final Score (AUDIT) 0      GAD-7    Flowsheet Row Clinical Support from 05/31/2022 in [facility] Office Visit from 04/19/2022 in [facility] Counselor from 04/18/2022 in [facility]  Total GAD-7 Score 14 18 21      $ PHQ2-9    Flowsheet Row Clinical Support from 05/31/2022 in [facility] Office Visit from 04/19/2022 in [facility] Counselor from 04/18/2022 in [facility] ED from 10/18/2021 in [facility]  PHQ-2 Total Score 4 5 4 4  $ PHQ-9 Total Score 18 20 16 12      $ [place] from 05/31/2022 in [facility] ED from 05/06/2022 in [facility] Emergency Department at [facility] Office Visit from 04/19/2022 in [facility] CATEGORY Moderate Risk No Risk Moderate Risk        Assessment and Plan:   Cathrine Khaleel. Bonnell Lerum is a 22 year old, Hispanic female with a past psychiatric history significant for borderline personality disorder, sleep disturbances, generalized anxiety disorder, and major depressive disorder who presents to [facility] for follow-up and medication management.  Patient continues to experience breakthrough symptoms of depression from the use of her sertraline.  Patient's depressive episodes are characterized by the following symptoms: irritability self-isolation, quietness, and crying spells.  Patient reports that her anxiety is manageable at this time.  Patient is interested in increasing her sertraline dosage.  Provider to increase patient's sertraline from 100 mg to 150 mg for the management of her depression.  Patient was agreeable to recommendation.  Patient's medication to be prescribed to pharmacy of choice.  Collaboration of Care: Collaboration of Care: Medication Management AEB provider managing patient's psychiatric medications, Psychiatrist AEB patient being followed by mental health provider at this facility, and Referral or follow-up with counselor/therapist AEB  patient being seen by a licensed clinical social worker at this facility  Patient/Guardian was advised Release of Information must be obtained prior to any record release in order to collaborate their care with an outside provider. Patient/Guardian was advised if they have not already done so to contact the registration department to sign all necessary forms in order for [place] to release information regarding their care.   Consent: Patient/Guardian gives verbal consent for treatment and assignment of benefits for services provided during this visit. Patient/Guardian expressed understanding and agreed to proceed.   1. Sleep disturbances Patient to continue taking trazodone 50 mg at bedtime for the management of her sleep disturbances  2. GAD (generalized anxiety disorder)  - hydrOXYzine (ATARAX) 10 MG tablet; Take 1 tablet (10  mg total) by mouth 3 (three) times daily as needed.  Dispense: 75 tablet; Refill: 1 - sertraline (ZOLOFT) 50 MG tablet; Take 3 tablets (150 mg total) by mouth daily.  Dispense: 90 tablet; Refill: 1  3. Severe episode of recurrent major depressive disorder, without psychotic features ([place])  - sertraline (ZOLOFT) 50 MG tablet; Take 3 tablets (150 mg total) by mouth daily.  Dispense: 90 tablet; Refill: 1  Patient to follow-up in 2 months Provider spent a total of 18 minutes with the patient/reviewing patient's chart  Malachy Mood, PA 05/31/2022, 12:11 PM

## 2022-06-01 ENCOUNTER — Telehealth (HOSPITAL_COMMUNITY): Payer: Self-pay | Admitting: *Deleted

## 2022-06-01 NOTE — Telephone Encounter (Signed)
PA for Zoloft generic for 50 mg 3 daily rejected by [employer]. Called her pharmacy to run a 100 mg and a 50 mg pill as the rejection was because the amount was greater than 45 for 30 days which the state wont allow. Pharmacy ran a test rx on two strengths. Pharmacist states she has already picked it up on 2/15 as written. No further action needed.

## 2022-06-01 NOTE — Telephone Encounter (Signed)
PA requested for patients Zoloft. Submitted thru covermymeds and waiting on their response.

## 2022-06-03 ENCOUNTER — Other Ambulatory Visit (HOSPITAL_COMMUNITY): Payer: Self-pay | Admitting: Physician Assistant

## 2022-06-03 DIAGNOSIS — F332 Major depressive disorder, recurrent severe without psychotic features: Secondary | ICD-10-CM

## 2022-06-03 DIAGNOSIS — F411 Generalized anxiety disorder: Secondary | ICD-10-CM

## 2022-06-14 ENCOUNTER — Emergency Department (HOSPITAL_COMMUNITY)
Admission: EM | Admit: 2022-06-14 | Discharge: 2022-06-14 | Disposition: A | Discharge status: Home / Self Care | Payer: Medicaid Other | Attending: Emergency Medicine | Admitting: Emergency Medicine

## 2022-06-14 ENCOUNTER — Encounter (HOSPITAL_COMMUNITY): Payer: Self-pay | Admitting: Emergency Medicine

## 2022-06-14 ENCOUNTER — Other Ambulatory Visit: Payer: Self-pay

## 2022-06-14 DIAGNOSIS — F10220 Alcohol dependence with intoxication, uncomplicated: Secondary | ICD-10-CM | POA: Diagnosis not present

## 2022-06-14 DIAGNOSIS — F10120 Alcohol abuse with intoxication, uncomplicated: Secondary | ICD-10-CM

## 2022-06-14 DIAGNOSIS — R112 Nausea with vomiting, unspecified: Secondary | ICD-10-CM | POA: Insufficient documentation

## 2022-06-14 LAB — CBC
HCT: 38.9 % (ref 36.0–46.0)
Hemoglobin: 13.4 g/dL (ref 12.0–15.0)
MCH: 32.6 pg (ref 26.0–34.0)
MCHC: 34.4 g/dL (ref 30.0–36.0)
MCV: 94.6 fL (ref 80.0–100.0)
Platelets: 273 10*3/uL (ref 150–400)
RBC: 4.11 MIL/uL (ref 3.87–5.11)
RDW: 12.9 % (ref 11.5–15.5)
WBC: 8.7 10*3/uL (ref 4.0–10.5)
nRBC: 0 % (ref 0.0–0.2)

## 2022-06-14 LAB — BASIC METABOLIC PANEL
Anion gap: 11 (ref 5–15)
BUN: 10 mg/dL (ref 6–20)
CO2: 23 mmol/L (ref 22–32)
Calcium: 9.1 mg/dL (ref 8.9–10.3)
Chloride: 104 mmol/L (ref 98–111)
Creatinine, Ser: 0.55 mg/dL (ref 0.44–1.00)
GFR, Estimated: >60 mL/min (ref >60)
Glucose, Bld: 135 mg/dL — ABNORMAL HIGH (ref 70–99)
Potassium: 3.6 mmol/L (ref 3.5–5.1)
Sodium: 138 mmol/L (ref 135–145)

## 2022-06-14 LAB — RAPID URINE DRUG SCREEN, HOSP PERFORMED
Amphetamines: NOT DETECTED
Barbiturates: NOT DETECTED
Benzodiazepines: NOT DETECTED
Cocaine: NOT DETECTED
Opiates: NOT DETECTED
Tetrahydrocannabinol: POSITIVE — AB

## 2022-06-14 LAB — PREGNANCY, URINE: Preg Test, Ur: NEGATIVE

## 2022-06-14 LAB — ETHANOL: Alcohol, Ethyl (B): 12 mg/dL — ABNORMAL HIGH (ref <10)

## 2022-06-14 MED ORDER — ONDANSETRON 8 MG PO TBDP: 8.0000 mg | ORAL_TABLET | Freq: Three times a day (TID) | ORAL | 0 refills | Status: AC | PRN

## 2022-06-14 MED ORDER — ONDANSETRON 4 MG PO TBDP
4.0000 mg | ORAL_TABLET | Freq: Once | ORAL | Status: AC
  Administered 2022-06-14: 4 mg via ORAL
  Filled 2022-06-14: qty 1

## 2022-06-14 MED ORDER — PROMETHAZINE HCL 25 MG PO TABS
25.0000 mg | ORAL_TABLET | Freq: Once | ORAL | Status: AC
  Administered 2022-06-14: 25 mg via ORAL
  Filled 2022-06-14: qty 1

## 2022-06-14 MED ORDER — SODIUM CHLORIDE 0.9 % IV BOLUS
1000.0000 mL | Freq: Once | INTRAVENOUS | Status: AC
  Administered 2022-06-14: 1000 mL via INTRAVENOUS

## 2022-06-14 NOTE — Discharge Instructions (Addendum)
Today you were evaluated for nausea and vomiting after excessive alcohol intake. You were treated with IV fluids and antinausea medicine. Your EKG showed no stress on your heart. Your labs were all normal, including red and white blood cell counts, blood electrolytes, and kidney function.  Your urine pregnancy test was negative.  Pick up the Zofran at your pharmacy.  Use this medicine as often as every 8 hours to help with nausea so you can keep fluids down.

## 2022-06-14 NOTE — ED Provider Notes (Incomplete)
22 y.o. female with h/o SI, borderline personality disorder, MDD who p/w c/f EtOH. Patient believes she has alcohol poisoning. She has been vomiting since 0900 this am. The patient reports taking 6 shots. She has had an episode of vomiting since she has been here.    Exam demonstrates ***.     No orders to display   Labs Reviewed  CBC  BASIC METABOLIC PANEL  ETHANOL  RAPID URINE DRUG SCREEN, HOSP PERFORMED  PREGNANCY, URINE

## 2022-06-14 NOTE — ED Triage Notes (Signed)
Patient believes she has alcohol poisoning. She has been vomiting since 0900 this am. The patient reports taking 6 shots. She has had an episode of vomiting since she has been here.

## 2022-06-14 NOTE — ED Provider Notes (Addendum)
[facility] Provider Note  CSN: [id number] Arrival date & time: 06/14/22  1123  History  Chief Complaint  Patient presents with   Emesis   Alcohol Problem   Sylvia Gray Sylvia Gray is a 22 y.o. female.  Sylvia Gray is a 22 year old previously healthy woman here today with nausea vomiting after excessive alcohol consumption.  She reports taking 6 shots of liquor between 11 PM and 1 AM overnight.  Started vomiting around 8 AM, reports she has not been able to keep any liquids down.  Last UOP this morning.  She is concerned for alcohol poisoning. Is Aox3 and alert.  PMH includes anxiety and depression on Zoloft.  Patient reports no other medications aside from OCP.   Home Medications Prior to Admission medications   Medication Sig Start Date End Date Taking? Authorizing Provider  ondansetron (ZOFRAN-ODT) 8 MG disintegrating tablet Take 1 tablet (8 mg total) by mouth every 8 (eight) hours as needed for nausea or vomiting. 06/14/22  Yes Ezequiel Essex, MD  amoxicillin-clavulanate (AUGMENTIN) 875-125 MG tablet Take 1 tablet by mouth every 12 (twelve) hours. 05/06/22   Smoot, Leary Roca, PA-C  clotrimazole (GYNE-LOTRIMIN) 1 % vaginal cream Place 1 Applicatorful vaginally at bedtime. 10/22/21   Corky Sox, MD  hydrOXYzine (ATARAX) 10 MG tablet Take 1 tablet (10 mg total) by mouth 3 (three) times daily as needed. 05/31/22   Nwoko, Terese Door, PA  metroNIDAZOLE (FLAGYL) 500 MG tablet Take 1 tablet (500 mg total) by mouth 2 (two) times daily. 10/25/21   Henderly, Britni A, PA-C  naproxen (NAPROSYN) 500 MG tablet Take 1 tablet (500 mg total) by mouth 2 (two) times daily. 10/25/21   Henderly, Britni A, PA-C  ondansetron (ZOFRAN-ODT) 4 MG disintegrating tablet Take 1 tablet (4 mg total) by mouth every 8 (eight) hours as needed for nausea or vomiting. 05/06/22   Smoot, Leary Roca, PA-C  sertraline (ZOLOFT) 50 MG tablet Take 3 tablets (150 mg total) by mouth  daily. 05/31/22 05/31/23  Nwoko, Terese Door, PA  traZODone (DESYREL) 50 MG tablet Take 1 tablet (50 mg total) by mouth at bedtime. 04/19/22   Nwoko, Uchenna E, PA  VIENVA 0.1-20 MG-MCG tablet Take 1 tablet by mouth daily. 08/22/21   [provider]  omeprazole (PRILOSEC) 40 MG capsule Take 1 capsule (40 mg total) by mouth daily. 03/05/19 04/12/19  Jacqlyn Larsen, PA-C  sucralfate (CARAFATE) 1 GM/10ML suspension Take 10 mLs (1 g total) by mouth 4 (four) times daily -  with meals and at bedtime. 03/05/19 04/12/19  Jacqlyn Larsen, PA-C     Allergies    Apple juice, Kiwi extract, Strawberry extract, and Lamotrigine    Review of Systems   Review of Systems  Constitutional:  Positive for appetite change. Negative for activity change, chills, diaphoresis and fatigue.  Gastrointestinal:  Positive for nausea and vomiting.  Psychiatric/Behavioral:  Positive for behavioral problems and decreased concentration.    Physical Exam Updated Vital Signs BP (!) 119/91 (BP Location: Left Arm)   Pulse 71   Temp 97.6 F (36.4 C) (Oral)   Resp 18   SpO2 99%  Physical Exam Vitals and nursing note reviewed.  Constitutional:      General: She is not in acute distress.    Appearance: Normal appearance. She is normal weight. She is ill-appearing. She is not toxic-appearing or diaphoretic.  HENT:     Head: Normocephalic and atraumatic.  Eyes:     General:  No scleral icterus.       Right eye: No discharge.        Left eye: No discharge.     Extraocular Movements: Extraocular movements intact.     Conjunctiva/sclera: Conjunctivae normal.     Pupils: Pupils are equal, round, and reactive to light.  Cardiovascular:     Rate and Rhythm: Normal rate and regular rhythm.     Pulses: Normal pulses.     Heart sounds: Normal heart sounds. No murmur heard. Pulmonary:     Effort: Pulmonary effort is normal. No respiratory distress.     Breath sounds: Normal breath sounds. No wheezing or rhonchi.  Chest:      Chest wall: No tenderness.  Abdominal:     General: Abdomen is flat. Bowel sounds are normal. There is no distension.     Palpations: Abdomen is soft.     Tenderness: There is no abdominal tenderness. There is no guarding or rebound.  Skin:    General: Skin is warm.     Capillary Refill: Capillary refill takes less than 2 seconds.  Neurological:     General: No focal deficit present.     Mental Status: She is alert and oriented to person, place, and time.  Psychiatric:        Mood and Affect: Mood normal.    ED Results / Procedures / Treatments   Labs (all labs ordered are listed, but only abnormal results are displayed) Labs Reviewed  BASIC METABOLIC PANEL - Abnormal; Notable for the following components:      Result Value   Glucose, Bld 135 (*)    All other components within normal limits  ETHANOL - Abnormal; Notable for the following components:   Alcohol, Ethyl (B) 12 (*)    All other components within normal limits  RAPID URINE DRUG SCREEN, HOSP PERFORMED - Abnormal; Notable for the following components:   Tetrahydrocannabinol POSITIVE (*)    All other components within normal limits  CBC  PREGNANCY, URINE   EKG EKG Interpretation  Date/Time:  Thursday June 14 2022 12:08:37 EST Ventricular Rate:  69 PR Interval:  142 QRS Duration: 99 QT Interval:  404 QTC Calculation: 433 R Axis:   78 Text Interpretation: Sinus rhythm Atrial premature complex Confirmed by Cindee Lame [phone]) on 06/14/2022 12:20:21 PM  Radiology No results found.  Procedures Procedures   Medications Ordered in ED Medications  sodium chloride 0.9 % bolus 1,000 mL (1,000 mLs Intravenous New Bag/Given 06/14/22 1212)  ondansetron (ZOFRAN-ODT) disintegrating tablet 4 mg (4 mg Oral Given 06/14/22 1212)  ondansetron (ZOFRAN-ODT) disintegrating tablet 4 mg (4 mg Oral Given 06/14/22 1246)   ED Course/ Medical Decision Making/ A&P  Medical Decision Making 22 year old woman here today with nausea  and vomiting s/p excessive alcohol consumption overnight.  Physical exam reassuring, vital signs stable.  Will obtain EKG, BMP, CBC, ethanol.  Will give 1 L NS and Zofran for nausea.  If stable for the next hour or so, expect safe discharge home after rehydration.  12:40 pm: Patient reports continued nausea despite Zofran 4 mg ODT.  Will give additional 4 mg ODT.  CBC, BMP normal. EKG unremarkable. UDS positive only for THC. IV bolus not yet finished due to patient occlusion of line with bent arm - educated patient to straighten arm. Ethanol slightly elevated at 12. Will discharge home with PO zofran ODT to assist with nausea.   3:30 pm: Patient's nausea has persisted despite additional 4 mg zofran (for total of  8 mg). S/p 2nd NS bolus of 1L. Will administer phenergan and discharge.   Amount and/or Complexity of Data Reviewed Labs: ordered. Decision-making details documented in ED Course.    Details: BMP, CBC, ethanol, UDS, urine pregnancy ECG/medicine tests: ordered. Decision-making details documented in ED Course.    Details: EKG  Risk Prescription drug management.   Final Clinical Impression(s) / ED Diagnoses Final diagnoses:  Alcoholic intoxication without complication (HCC)  Nausea and vomiting, unspecified vomiting type   Rx / DC Orders ED Discharge Orders          Ordered    ondansetron (ZOFRAN-ODT) 8 MG disintegrating tablet  Every 8 hours PRN        06/14/22 1239           Ezequiel Essex, MD   Ezequiel Essex, MD 06/14/22 1254    Ezequiel Essex, MD 06/14/22 1538    Audley Hose, MD 06/21/22 [phone]

## 2022-08-01 ENCOUNTER — Telehealth (INDEPENDENT_AMBULATORY_CARE_PROVIDER_SITE_OTHER): Payer: Medicaid Other | Admitting: Physician Assistant

## 2022-08-01 ENCOUNTER — Encounter (HOSPITAL_COMMUNITY): Payer: Self-pay | Admitting: Physician Assistant

## 2022-08-01 DIAGNOSIS — G479 Sleep disorder, unspecified: Secondary | ICD-10-CM | POA: Diagnosis not present

## 2022-08-01 DIAGNOSIS — F411 Generalized anxiety disorder: Secondary | ICD-10-CM

## 2022-08-01 DIAGNOSIS — F332 Major depressive disorder, recurrent severe without psychotic features: Secondary | ICD-10-CM | POA: Diagnosis not present

## 2022-08-01 MED ORDER — SERTRALINE HCL 100 MG PO TABS: 200.0000 mg | ORAL_TABLET | Freq: Every day | ORAL | 1 refills | Status: DC

## 2022-08-01 MED ORDER — HYDROXYZINE HCL 10 MG PO TABS: 10.0000 mg | ORAL_TABLET | Freq: Three times a day (TID) | ORAL | 1 refills | Status: DC | PRN

## 2022-08-01 MED ORDER — TRAZODONE HCL 50 MG PO TABS: 50.0000 mg | ORAL_TABLET | Freq: Every day | ORAL | 1 refills | Status: DC

## 2022-08-01 NOTE — Progress Notes (Signed)
[facility] MD/PA/NP OP Progress Note  08/02/2022 9:10 PM Sylvia Gray  MRN:  [id number]  Chief Complaint:  Chief Complaint  Patient presents with   Follow-up   Medication Management   HPI:   Sylvia Gray. Sylvia Gray is a 22 year old, Hispanic female with a past psychiatric history significant for borderline personality disorder, sleep disturbances, generalized anxiety disorder, and major depressive disorder who presents to [facility] for follow-up and medication management.  Patient is currently being managed on the following psychiatric medications:  Sertraline 150 mg daily Hydroxyzine 10 mg 3 times daily as needed Trazodone 50 mg at bedtime  Patient reports that her medications were helpful in the beginning but feels that they have waned in efficacy.  Patient feels that her current sertraline dose needs to be adjusted.  Patient rates her depression an 8 out of 10 with 10 being most severe.  Patient endorses depressive episodes every day with symptoms lasting the whole day.  Patient's depressive episodes are characterized by the following symptoms: self isolation, sadness, lack of motivation, decreased concentration, feelings of worthlessness/guilt, and hopelessness.  Patient reports that her depression is worsened by minor inconveniences in life.  Patient states that her anxiety has been well-managed but she does not take hydroxyzine as often. A PHQ-9 screen was performed with the patient scoring a 19.  A GAD-7 screen was also performed with the patient scoring a 7.  Patient is alert and oriented x 4, calm, cooperative, and fully engaged in conversation during the encounter.  Patient describes her mood as restless and irritable.  Patient denies suicidal or homicidal ideations.  She further denies auditory or visual hallucinations and does not appear to be responding to internal/external stimuli.  Patient endorses good sleep and receives on  average 8 to 9 hours of sleep each night.  Patient endorses fair appetite and eats on average 1-2 meals per day.  Patient denies alcohol consumption, tobacco use, and illicit drug use.  Visit Diagnosis:    ICD-10-CM   1. GAD (generalized anxiety disorder)  F41.1 sertraline (ZOLOFT) 100 MG tablet    hydrOXYzine (ATARAX) 10 MG tablet    2. Severe episode of recurrent major depressive disorder, without psychotic features  F33.2 sertraline (ZOLOFT) 100 MG tablet    3. Sleep disturbances  G47.9 traZODone (DESYREL) 50 MG tablet      Past Psychiatric History:  Borderline personality disorder Bipolar disorder Anxiety Depression  Past Medical History:  Past Medical History:  Diagnosis Date   Anxiety    Bipolar 1 disorder    Borderline personality disorder    on Zoloft   Depression    Medical history non-contributory     Past Surgical History:  Procedure Laterality Date   NO PAST SURGERIES     WISDOM TOOTH EXTRACTION      Family Psychiatric History:  Patient states that her cousins on her father's side of the family suffer from depression. Patient believes that her cousins are not taking any medications at this time. Patient denies history of suicide attempt within her family. Patient denies history of homicide attempts within her family. Patient further denies substance abuse in her family.   Family History: History reviewed. No pertinent family history.  Social History:  Social History   Socioeconomic History   Marital status: Single    Spouse name: Not on file   Number of children: Not on file   Years of education: Not on file   Highest education level: Not  on file  Occupational History   Not on file  Tobacco Use   Smoking status: Never   Smokeless tobacco: Never  Vaping Use   Vaping Use: Every day  Substance and Sexual Activity   Alcohol use: Yes    Comment: occ   Drug use: Never   Sexual activity: Yes    Birth control/protection: Pill  Other Topics Concern    Not on file  Social History Narrative   Not on file   Social Determinants of Health   Financial Resource Strain: Not on file  Food Insecurity: Not on file  Transportation Needs: Not on file  Physical Activity: Not on file  Stress: Not on file  Social Connections: Not on file    Allergies:  Allergies  Allergen Reactions   Apple Juice Swelling   Kiwi Extract Swelling   Strawberry Extract Swelling   Lamotrigine Rash    Urticaria started 2 weeks after starting lamotrigine    Metabolic Disorder Labs: Lab Results  Component Value Date   HGBA1C 4.6 (L) 10/18/2021   MPG 85.32 10/18/2021   No results found for: "PROLACTIN" Lab Results  Component Value Date   CHOL 217 (H) 10/18/2021   TRIG 52 10/18/2021   HDL 56 10/18/2021   CHOLHDL 3.9 10/18/2021   VLDL 10 10/18/2021   LDLCALC 151 (H) 10/18/2021   Lab Results  Component Value Date   TSH 0.319 (L) 10/18/2021    Therapeutic Level Labs: No results found for: "LITHIUM" No results found for: "VALPROATE" No results found for: "CBMZ"  Current Medications: Current Outpatient Medications  Medication Sig Dispense Refill   amoxicillin-clavulanate (AUGMENTIN) 875-125 MG tablet Take 1 tablet by mouth every 12 (twelve) hours. 14 tablet 0   clotrimazole (GYNE-LOTRIMIN) 1 % vaginal cream Place 1 Applicatorful vaginally at bedtime. 45 g 0   hydrOXYzine (ATARAX) 10 MG tablet Take 1 tablet (10 mg total) by mouth 3 (three) times daily as needed. 75 tablet 1   metroNIDAZOLE (FLAGYL) 500 MG tablet Take 1 tablet (500 mg total) by mouth 2 (two) times daily. 14 tablet 0   naproxen (NAPROSYN) 500 MG tablet Take 1 tablet (500 mg total) by mouth 2 (two) times daily. 30 tablet 0   ondansetron (ZOFRAN-ODT) 4 MG disintegrating tablet Take 1 tablet (4 mg total) by mouth every 8 (eight) hours as needed for nausea or vomiting. 20 tablet 0   ondansetron (ZOFRAN-ODT) 8 MG disintegrating tablet Take 1 tablet (8 mg total) by mouth every 8 (eight) hours  as needed for nausea or vomiting. 10 tablet 0   sertraline (ZOLOFT) 100 MG tablet Take 2 tablets (200 mg total) by mouth daily. 60 tablet 1   traZODone (DESYREL) 50 MG tablet Take 1 tablet (50 mg total) by mouth at bedtime. 30 tablet 1   VIENVA 0.1-20 MG-MCG tablet Take 1 tablet by mouth daily.     No current facility-administered medications for this visit.     Musculoskeletal: Strength & Muscle Tone: within normal limits Gait & Station: normal Patient leans: N/A  Psychiatric Specialty Exam: Review of Systems  Psychiatric/Behavioral:  Negative for decreased concentration, dysphoric mood, hallucinations, self-injury, sleep disturbance and suicidal ideas. The patient is nervous/anxious. The patient is not hyperactive.     There were no vitals taken for this visit.There is no height or weight on file to calculate BMI.  General Appearance: Casual  Eye Contact:  Good  Speech:  Clear and Coherent and Normal Rate  Volume:  Normal  Mood:  Anxious and Depressed  Affect:  Appropriate  Thought Process:  Coherent, Goal Directed, and Descriptions of Associations: Intact  Orientation:  Full (Time, Place, and Person)  Thought Content: WDL and Logical   Suicidal Thoughts:  No  Homicidal Thoughts:  No  Memory:  Immediate;   Good Recent;   Good Remote;   Good  Judgement:  Good  Insight:  Good  Psychomotor Activity:  Normal  Concentration:  Concentration: Good and Attention Span: Good  Recall:  Good  Fund of Knowledge: Good  Language: Good  Akathisia:  No  Handed:  Right  AIMS (if indicated): not done  Assets:  Communication Skills Desire for Improvement Financial Resources/Insurance Housing Social Support Transportation Vocational/Educational  ADL's:  Intact  Cognition: WNL  Sleep:  Good   Screenings: AUDIT    Flowsheet Row [facility]  Alcohol Use Disorder Identification Test Final Score (AUDIT) 0       GAD-7    Flowsheet Row Video Visit from 08/01/2022 in [facility] Clinical Support from 05/31/2022 in [facility] Office Visit from 04/19/2022 in [facility] Counselor from 04/18/2022 in [facility]  Total GAD-7 Score 7 14 18 21       PHQ2-9    Flowsheet Row Video Visit from 08/01/2022 in [facility] Clinical Support from 05/31/2022 in [facility] Office Visit from 04/19/2022 in [facility] Counselor from 04/18/2022 in [facility] ED from 10/18/2021 in [facility]  PHQ-2 Total Score 4 4 5 4 4   PHQ-9 Total Score 19 18 20 16 12       Flowsheet Row Video Visit from 08/01/2022 in [facility] ED from 06/14/2022 in [facility] Emergency Department at [facility] Clinical Support from 05/31/2022 in [facility]  C-SSRS RISK CATEGORY Moderate Risk No Risk Moderate Risk        Assessment and Plan:   Tracy Gerken. Chyann Ambrocio is a 22 year old, Hispanic female with a past psychiatric history significant for borderline personality disorder, sleep disturbances, generalized anxiety disorder, and major depressive disorder who presents to [facility] for follow-up and medication management.  Patient presents today encounter stating that her sertraline dosage has been less effective in managing her depressive symptoms.  Patient is interested in adjusting her dosage of sertraline for the management of her depressive symptoms.  Patient states that her anxiety has been well-managed but she has had to rely less on using hydroxyzine.  Provider recommended increasing her sertraline dosage from 150 mg to 200 mg daily for the management of her depressive symptoms.   Patient was agreeable to recommendation.  Patient's medications to be e-prescribed to pharmacy of choice.  Collaboration of Care: Collaboration of Care: Medication Management AEB provider managing patient's psychiatric medications, Psychiatrist AEB patient being followed by mental health provider at this facility, and Referral or follow-up with counselor/therapist AEB patient being seen by a licensed clinical social worker at this facility  Patient/Guardian was advised Release of Information must be obtained prior to any record release in order to collaborate their care with an outside provider. Patient/Guardian was advised if they have not already done so to contact the registration department to sign all necessary forms in order for [place] to release information regarding their care.   Consent: Patient/Guardian  gives verbal consent for treatment and assignment of benefits for services provided during this visit. Patient/Guardian expressed understanding and agreed to proceed.   1. GAD (generalized anxiety disorder)  - sertraline (ZOLOFT) 100 MG tablet; Take 2 tablets (200 mg total) by mouth daily.  Dispense: 60 tablet; Refill: 1 - hydrOXYzine (ATARAX) 10 MG tablet; Take 1 tablet (10 mg total) by mouth 3 (three) times daily as needed.  Dispense: 75 tablet; Refill: 1  2. Severe episode of recurrent major depressive disorder, without psychotic features  - sertraline (ZOLOFT) 100 MG tablet; Take 2 tablets (200 mg total) by mouth daily.  Dispense: 60 tablet; Refill: 1  3. Sleep disturbances  - traZODone (DESYREL) 50 MG tablet; Take 1 tablet (50 mg total) by mouth at bedtime.  Dispense: 30 tablet; Refill: 1  Patient to follow-up in 6 weeks  Provider spent a total of 15 minutes with the patient/reviewing patient's chart  Meta Hatchet, PA 08/02/2022, 9:10 PM

## 2022-08-06 ENCOUNTER — Other Ambulatory Visit (HOSPITAL_COMMUNITY): Payer: Self-pay | Admitting: Physician Assistant

## 2022-08-06 DIAGNOSIS — F411 Generalized anxiety disorder: Secondary | ICD-10-CM

## 2022-08-06 DIAGNOSIS — F332 Major depressive disorder, recurrent severe without psychotic features: Secondary | ICD-10-CM

## 2022-09-19 ENCOUNTER — Telehealth (HOSPITAL_COMMUNITY): Payer: Medicaid Other | Admitting: Physician Assistant

## 2022-09-21 ENCOUNTER — Other Ambulatory Visit (HOSPITAL_COMMUNITY): Payer: Self-pay | Admitting: Physician Assistant

## 2022-09-21 DIAGNOSIS — F411 Generalized anxiety disorder: Secondary | ICD-10-CM

## 2022-09-21 DIAGNOSIS — F332 Major depressive disorder, recurrent severe without psychotic features: Secondary | ICD-10-CM

## 2022-09-28 ENCOUNTER — Telehealth (INDEPENDENT_AMBULATORY_CARE_PROVIDER_SITE_OTHER): Payer: Medicaid Other | Admitting: Physician Assistant

## 2022-09-28 DIAGNOSIS — G479 Sleep disorder, unspecified: Secondary | ICD-10-CM | POA: Diagnosis not present

## 2022-09-28 DIAGNOSIS — F411 Generalized anxiety disorder: Secondary | ICD-10-CM | POA: Diagnosis not present

## 2022-09-28 DIAGNOSIS — F332 Major depressive disorder, recurrent severe without psychotic features: Secondary | ICD-10-CM | POA: Diagnosis not present

## 2022-10-01 ENCOUNTER — Encounter (HOSPITAL_COMMUNITY): Payer: Self-pay | Admitting: Physician Assistant

## 2022-10-01 MED ORDER — HYDROXYZINE HCL 10 MG PO TABS: 10.0000 mg | ORAL_TABLET | Freq: Three times a day (TID) | ORAL | 1 refills | Status: DC | PRN

## 2022-10-01 MED ORDER — SERTRALINE HCL 100 MG PO TABS: ORAL_TABLET | ORAL | 2 refills | Status: DC

## 2022-10-01 MED ORDER — TRAZODONE HCL 50 MG PO TABS: 50.0000 mg | ORAL_TABLET | Freq: Every day | ORAL | 2 refills | Status: DC

## 2022-10-01 NOTE — Progress Notes (Signed)
BH MD/PA/NP OP Progress Note  Virtual Visit via Video Note  I connected with Sylvia Gray on 10/01/22 at  4:00 PM EDT by a video enabled telemedicine application and verified that I am speaking with the correct person using two identifiers.  Location: Patient: Home Provider: Clinic   I discussed the limitations of evaluation and management by telemedicine and the availability of in person appointments. The patient expressed understanding and agreed to proceed.  Follow Up Instructions:   I discussed the assessment and treatment plan with the patient. The patient was provided an opportunity to ask questions and all were answered. The patient agreed with the plan and demonstrated an understanding of the instructions.   The patient was advised to call back or seek an in-person evaluation if the symptoms worsen or if the condition fails to improve as anticipated.  I provided 10 minutes of non-face-to-face time during this encounter.  Meta Hatchet, PA    10/01/2022 11:00 AM Sylvia Gray  MRN:  [id number]  Chief Complaint:  Chief Complaint  Patient presents with   Follow-up   Medication Refill   HPI:   Sylvia Gray is a 22 year old, Hispanic female with a past psychiatric history significant for borderline personality disorder, sleep disturbances, generalized anxiety disorder, and major depressive disorder who presents to [facility] for follow-up and medication management.  Patient is currently being managed on the following psychiatric medications:  Sertraline 200 mg daily Hydroxyzine 10 mg 3 times daily as needed Trazodone 50 mg at bedtime  Patient reports no issues or concerns regarding her current medication regimen.  Patient denies experiencing any adverse side effects at this time and denies the need for dosage adjustments at this time.  Patient denies depressive episodes nor does she  endorse anxiety.  Although patient denies depressive episodes, she does experience fatigue and tiredness she attributes to her fibromyalgia.  Patient denies any new stressors at this time.  A PHQ-9 screen was performed with the patient scoring a 16.  A GAD-7 screen was also performed with the patient scoring a 3.  Patient is alert and oriented x 4, calm, cooperative, and fully engaged in conversation during the encounter.  Patient endorses okay mood.  Patient denies suicidal or homicidal ideations.  She further denies auditory or visual hallucinations and does not appear to be responding to internal/external stimuli.  Patient endorses improved sleep and receives on average more than 8 hours of sleep per night; however, patient reports that she always remains tired or drowsy.  Patient endorses good appetite and eats on average 2 meals per day.  Patient denies alcohol consumption or tobacco use.  Patient endorses illicit drug use in the form of marijuana but states that she does not smoke often.  Visit Diagnosis:    ICD-10-CM   1. Sleep disturbances  G47.9 traZODone (DESYREL) 50 MG tablet    2. GAD (generalized anxiety disorder)  F41.1 hydrOXYzine (ATARAX) 10 MG tablet    sertraline (ZOLOFT) 100 MG tablet    3. Severe episode of recurrent major depressive disorder, without psychotic features (HCC)  F33.2 sertraline (ZOLOFT) 100 MG tablet      Past Psychiatric History:  Borderline personality disorder Bipolar disorder Anxiety Depression  Past Medical History:  Past Medical History:  Diagnosis Date   Anxiety    Bipolar 1 disorder (HCC)    Borderline personality disorder (HCC)    on Zoloft   Depression    Medical  history non-contributory     Past Surgical History:  Procedure Laterality Date   NO PAST SURGERIES     WISDOM TOOTH EXTRACTION      Family Psychiatric History:  Patient states that her cousins on her father's side of the family suffer from depression. Patient believes that  her cousins are not taking any medications at this time. Patient denies history of suicide attempt within her family. Patient denies history of homicide attempts within her family. Patient further denies substance abuse in her family.   Family History: History reviewed. No pertinent family history.  Social History:  Social History   Socioeconomic History   Marital status: Single    Spouse name: Not on file   Number of children: Not on file   Years of education: Not on file   Highest education level: Not on file  Occupational History   Not on file  Tobacco Use   Smoking status: Never   Smokeless tobacco: Never  Vaping Use   Vaping Use: Every day  Substance and Sexual Activity   Alcohol use: Yes    Comment: occ   Drug use: Never   Sexual activity: Yes    Birth control/protection: Pill  Other Topics Concern   Not on file  Social History Narrative   Not on file   Social Determinants of Health   Financial Resource Strain: Not on file  Food Insecurity: Not on file  Transportation Needs: Not on file  Physical Activity: Not on file  Stress: Not on file  Social Connections: Not on file    Allergies:  Allergies  Allergen Reactions   Apple Juice Swelling   Kiwi Extract Swelling   Strawberry Extract Swelling   Lamotrigine Rash    Urticaria started 2 weeks after starting lamotrigine    Metabolic Disorder Labs: Lab Results  Component Value Date   HGBA1C 4.6 (L) 10/18/2021   MPG 85.32 10/18/2021   No results found for: "PROLACTIN" Lab Results  Component Value Date   CHOL 217 (H) 10/18/2021   TRIG 52 10/18/2021   HDL 56 10/18/2021   CHOLHDL 3.9 10/18/2021   VLDL 10 10/18/2021   LDLCALC 151 (H) 10/18/2021   Lab Results  Component Value Date   TSH 0.319 (L) 10/18/2021    Therapeutic Level Labs: No results found for: "LITHIUM" No results found for: "VALPROATE" No results found for: "CBMZ"  Current Medications: Current Outpatient Medications  Medication Sig  Dispense Refill   amoxicillin-clavulanate (AUGMENTIN) 875-125 MG tablet Take 1 tablet by mouth every 12 (twelve) hours. 14 tablet 0   clotrimazole (GYNE-LOTRIMIN) 1 % vaginal cream Place 1 Applicatorful vaginally at bedtime. 45 g 0   hydrOXYzine (ATARAX) 10 MG tablet Take 1 tablet (10 mg total) by mouth 3 (three) times daily as needed. 75 tablet 1   metroNIDAZOLE (FLAGYL) 500 MG tablet Take 1 tablet (500 mg total) by mouth 2 (two) times daily. 14 tablet 0   naproxen (NAPROSYN) 500 MG tablet Take 1 tablet (500 mg total) by mouth 2 (two) times daily. 30 tablet 0   ondansetron (ZOFRAN-ODT) 4 MG disintegrating tablet Take 1 tablet (4 mg total) by mouth every 8 (eight) hours as needed for nausea or vomiting. 20 tablet 0   ondansetron (ZOFRAN-ODT) 8 MG disintegrating tablet Take 1 tablet (8 mg total) by mouth every 8 (eight) hours as needed for nausea or vomiting. 10 tablet 0   sertraline (ZOLOFT) 100 MG tablet TAKE 2 TABLETS(200 MG) BY MOUTH DAILY 60 tablet 2  traZODone (DESYREL) 50 MG tablet Take 1 tablet (50 mg total) by mouth at bedtime. 30 tablet 2   VIENVA 0.1-20 MG-MCG tablet Take 1 tablet by mouth daily.     No current facility-administered medications for this visit.     Musculoskeletal: Strength & Muscle Tone: within normal limits Gait & Station: normal Patient leans: N/A  Psychiatric Specialty Exam: Review of Systems  Psychiatric/Behavioral:  Negative for decreased concentration, dysphoric mood, hallucinations, self-injury, sleep disturbance and suicidal ideas. The patient is not nervous/anxious and is not hyperactive.     There were no vitals taken for this visit.There is no height or weight on file to calculate BMI.  General Appearance: Casual  Eye Contact:  Good  Speech:  Clear and Coherent and Normal Rate  Volume:  Normal  Mood:  Euthymic  Affect:  Appropriate  Thought Process:  Coherent, Goal Directed, and Descriptions of Associations: Intact  Orientation:  Full (Time,  Place, and Person)  Thought Content: WDL and Logical   Suicidal Thoughts:  No  Homicidal Thoughts:  No  Memory:  Immediate;   Good Recent;   Good Remote;   Good  Judgement:  Good  Insight:  Good  Psychomotor Activity:  Normal  Concentration:  Concentration: Good and Attention Span: Good  Recall:  Good  Fund of Knowledge: Good  Language: Good  Akathisia:  No  Handed:  Right  AIMS (if indicated): not done  Assets:  Communication Skills Desire for Improvement Financial Resources/Insurance Housing Social Support Transportation Vocational/Educational  ADL's:  Intact  Cognition: WNL  Sleep:  Good   Screenings: AUDIT    Flowsheet Row [facility]  Alcohol Use Disorder Identification Test Final Score (AUDIT) 0      GAD-7    Flowsheet Row Video Visit from 09/28/2022 in [facility] Video Visit from 08/01/2022 in [facility] Clinical Support from 05/31/2022 in [facility] Office Visit from 04/19/2022 in [facility] Counselor from 04/18/2022 in [facility]  Total GAD-7 Score 3 7 14 18 21       PHQ2-9    Flowsheet Row Video Visit from 09/28/2022 in [facility] Video Visit from 08/01/2022 in [facility] Clinical Support from 05/31/2022 in [facility] Office Visit from 04/19/2022 in [facility] Counselor from 04/18/2022 in [facility]  PHQ-2 Total Score 4 4 4 5 4   PHQ-9 Total Score 16 19 18 20 16       Flowsheet Row Video Visit from 09/28/2022 in [facility] Video Visit from 08/01/2022 in [facility] ED from 06/14/2022 in [facility] at [facility]  C-SSRS  RISK CATEGORY Moderate Risk Moderate Risk No Risk        Assessment and Plan:   Landri Stello. Nasiah Pawlak is a 22 year old, Hispanic female with a past psychiatric history significant for borderline personality disorder, sleep disturbances, generalized anxiety disorder, and major depressive disorder who presents to [facility] for follow-up and medication management.  Patient presents today encounter denying any issues with her medications at this time.  Patient denies experiencing any adverse side effects and further denies the need for dosage adjustments at this time.  Although patient denies depressive episodes or anxiety, she still continues to endorse fatigue and  drowsiness she attributes to her fibromyalgia.  Patient endorses stability on her current medication regimen and will continue taking medications as prescribed.  Patient's medications to be e-prescribed to pharmacy of choice.  Collaboration of Care: Collaboration of Care: Medication Management AEB provider managing patient's psychiatric medications, Psychiatrist AEB patient being followed by mental health provider at this facility, and Referral or follow-up with counselor/therapist AEB patient being seen by a licensed clinical social worker at this facility  Patient/Guardian was advised Release of Information must be obtained prior to any record release in order to collaborate their care with an outside provider. Patient/Guardian was advised if they have not already done so to contact the registration department to sign all necessary forms in order for [place] to release information regarding their care.   Consent: Patient/Guardian gives verbal consent for treatment and assignment of benefits for services provided during this visit. Patient/Guardian expressed understanding and agreed to proceed.   1. Sleep disturbances  - traZODone (DESYREL) 50 MG tablet; Take 1 tablet (50 mg total) by mouth at  bedtime.  Dispense: 30 tablet; Refill: 2  2. GAD (generalized anxiety disorder)  - hydrOXYzine (ATARAX) 10 MG tablet; Take 1 tablet (10 mg total) by mouth 3 (three) times daily as needed.  Dispense: 75 tablet; Refill: 1 - sertraline (ZOLOFT) 100 MG tablet; TAKE 2 TABLETS(200 MG) BY MOUTH DAILY  Dispense: 60 tablet; Refill: 2  3. Severe episode of recurrent major depressive disorder, without psychotic features (HCC)  - sertraline (ZOLOFT) 100 MG tablet; TAKE 2 TABLETS(200 MG) BY MOUTH DAILY  Dispense: 60 tablet; Refill: 2  Patient to follow-up in 6 weeks  Provider spent a total of 10 minutes with the patient/reviewing patient's chart  Meta Hatchet, PA 10/01/2022, 11:00 AM

## 2022-11-30 ENCOUNTER — Telehealth (HOSPITAL_COMMUNITY): Payer: Medicaid Other | Admitting: Physician Assistant

## 2022-11-30 ENCOUNTER — Encounter (HOSPITAL_COMMUNITY): Payer: Self-pay | Admitting: Physician Assistant

## 2022-11-30 DIAGNOSIS — F332 Major depressive disorder, recurrent severe without psychotic features: Secondary | ICD-10-CM

## 2022-11-30 DIAGNOSIS — G479 Sleep disorder, unspecified: Secondary | ICD-10-CM

## 2022-11-30 DIAGNOSIS — F411 Generalized anxiety disorder: Secondary | ICD-10-CM | POA: Diagnosis not present

## 2022-11-30 MED ORDER — SERTRALINE HCL 100 MG PO TABS: ORAL_TABLET | ORAL | 3 refills | Status: DC

## 2022-11-30 MED ORDER — HYDROXYZINE HCL 10 MG PO TABS: 10.0000 mg | ORAL_TABLET | Freq: Three times a day (TID) | ORAL | 1 refills | Status: DC | PRN

## 2022-11-30 MED ORDER — TRAZODONE HCL 50 MG PO TABS: 50.0000 mg | ORAL_TABLET | Freq: Every day | ORAL | 3 refills | Status: DC

## 2022-11-30 NOTE — Progress Notes (Signed)
BH MD/PA/NP OP Progress Note  Virtual Visit via Video Note  I connected with Sylvia Gray on 11/30/22 at  4:00 PM EDT by a video enabled telemedicine application and verified that I am speaking with the correct person using two identifiers.  Location: Patient: Home Provider: Clinic   I discussed the limitations of evaluation and management by telemedicine and the availability of in person appointments. The patient expressed understanding and agreed to proceed.  Follow Up Instructions:   I discussed the assessment and treatment plan with the patient. The patient was provided an opportunity to ask questions and all were answered. The patient agreed with the plan and demonstrated an understanding of the instructions.   The patient was advised to call back or seek an in-person evaluation if the symptoms worsen or if the condition fails to improve as anticipated.  I provided 6 minutes of non-face-to-face time during this encounter.  Meta Hatchet, PA    11/30/2022 6:13 PM Sylvia Gray  MRN:  [id number]  Chief Complaint:  Chief Complaint  Patient presents with   Follow-up   Medication Refill   HPI:   Sylvia Gray is a 22 year old, Hispanic female with a past psychiatric history significant for borderline personality disorder, sleep disturbances, generalized anxiety disorder, and major depressive disorder who presents to [facility] via virtual video visit for follow-up and medication management.  Patient is currently being managed on the following psychiatric medications:  Sertraline 200 mg daily Hydroxyzine 10 mg 3 times daily as needed Trazodone 50 mg at bedtime  Patient reports no issues or concerns regarding her current medication regimen.  Patient denies experiencing any adverse side effects and further denies the need for dosage adjustments at this time.  Patient denies depression or  anxiety.  She further denies stressors at this time.  Patient inquired about receiving a letter for emotional support animal prior to the conclusion of the encounter.  A GAD-7 screen was also performed with the patient scoring a 2.  Patient is alert and oriented x 4, calm, cooperative, and fully engaged in conversation during the encounter.  Patient endorses good mood.  Patient denies suicidal or homicidal ideations.  She further denied auditory or visual hallucinations and does not appear to be responding to internal fresh external stimuli.  Patient endorses good sleep and receives on average 8 to 9 hours of sleep each night.  Patient endorses fair appetite and eats on average 2 meals per day.  Patient denies alcohol consumption, tobacco use, or illicit drug use.  Visit Diagnosis:    ICD-10-CM   1. Sleep disturbances  G47.9 traZODone (DESYREL) 50 MG tablet    2. GAD (generalized anxiety disorder)  F41.1 hydrOXYzine (ATARAX) 10 MG tablet    sertraline (ZOLOFT) 100 MG tablet    3. Severe episode of recurrent major depressive disorder, without psychotic features (HCC)  F33.2 sertraline (ZOLOFT) 100 MG tablet      Past Psychiatric History:  Borderline personality disorder Bipolar disorder Anxiety Depression  Past Medical History:  Past Medical History:  Diagnosis Date   Anxiety    Bipolar 1 disorder (HCC)    Borderline personality disorder (HCC)    on Zoloft   Depression    Medical history non-contributory     Past Surgical History:  Procedure Laterality Date   NO PAST SURGERIES     WISDOM TOOTH EXTRACTION      Family Psychiatric History:  Patient states that her  cousins on her father's side of the family suffer from depression. Patient believes that her cousins are not taking any medications at this time. Patient denies history of suicide attempt within her family. Patient denies history of homicide attempts within her family. Patient further denies substance abuse in her family.    Family History: History reviewed. No pertinent family history.  Social History:  Social History   Socioeconomic History   Marital status: Single    Spouse name: Not on file   Number of children: Not on file   Years of education: Not on file   Highest education level: Not on file  Occupational History   Not on file  Tobacco Use   Smoking status: Never   Smokeless tobacco: Never  Vaping Use   Vaping status: Every Day  Substance and Sexual Activity   Alcohol use: Yes    Comment: occ   Drug use: Never   Sexual activity: Yes    Birth control/protection: Pill  Other Topics Concern   Not on file  Social History Narrative   Not on file   Social Determinants of Health   Financial Resource Strain: Not on file  Food Insecurity: Not on file  Transportation Needs: Not on file  Physical Activity: Not on file  Stress: Not on file  Social Connections: Not on file    Allergies:  Allergies  Allergen Reactions   Apple Juice Swelling   Kiwi Extract Swelling   Strawberry Extract Swelling   Lamotrigine Rash    Urticaria started 2 weeks after starting lamotrigine    Metabolic Disorder Labs: Lab Results  Component Value Date   HGBA1C 4.6 (L) 10/18/2021   MPG 85.32 10/18/2021   No results found for: "PROLACTIN" Lab Results  Component Value Date   CHOL 217 (H) 10/18/2021   TRIG 52 10/18/2021   HDL 56 10/18/2021   CHOLHDL 3.9 10/18/2021   VLDL 10 10/18/2021   LDLCALC 151 (H) 10/18/2021   Lab Results  Component Value Date   TSH 0.319 (L) 10/18/2021    Therapeutic Level Labs: No results found for: "LITHIUM" No results found for: "VALPROATE" No results found for: "CBMZ"  Current Medications: Current Outpatient Medications  Medication Sig Dispense Refill   amoxicillin-clavulanate (AUGMENTIN) 875-125 MG tablet Take 1 tablet by mouth every 12 (twelve) hours. 14 tablet 0   clotrimazole (GYNE-LOTRIMIN) 1 % vaginal cream Place 1 Applicatorful vaginally at bedtime. 45 g  0   hydrOXYzine (ATARAX) 10 MG tablet Take 1 tablet (10 mg total) by mouth 3 (three) times daily as needed. 75 tablet 1   metroNIDAZOLE (FLAGYL) 500 MG tablet Take 1 tablet (500 mg total) by mouth 2 (two) times daily. 14 tablet 0   naproxen (NAPROSYN) 500 MG tablet Take 1 tablet (500 mg total) by mouth 2 (two) times daily. 30 tablet 0   ondansetron (ZOFRAN-ODT) 4 MG disintegrating tablet Take 1 tablet (4 mg total) by mouth every 8 (eight) hours as needed for nausea or vomiting. 20 tablet 0   ondansetron (ZOFRAN-ODT) 8 MG disintegrating tablet Take 1 tablet (8 mg total) by mouth every 8 (eight) hours as needed for nausea or vomiting. 10 tablet 0   sertraline (ZOLOFT) 100 MG tablet TAKE 2 TABLETS(200 MG) BY MOUTH DAILY 60 tablet 3   traZODone (DESYREL) 50 MG tablet Take 1 tablet (50 mg total) by mouth at bedtime. 30 tablet 3   VIENVA 0.1-20 MG-MCG tablet Take 1 tablet by mouth daily.     No current  facility-administered medications for this visit.     Musculoskeletal: Strength & Muscle Tone: within normal limits Gait & Station: normal Patient leans: N/A  Psychiatric Specialty Exam: Review of Systems  Psychiatric/Behavioral:  Negative for decreased concentration, dysphoric mood, hallucinations, self-injury, sleep disturbance and suicidal ideas. The patient is not nervous/anxious and is not hyperactive.     There were no vitals taken for this visit.There is no height or weight on file to calculate BMI.  General Appearance: Casual  Eye Contact:  Good  Speech:  Clear and Coherent and Normal Rate  Volume:  Normal  Mood:  Euthymic  Affect:  Appropriate  Thought Process:  Coherent, Goal Directed, and Descriptions of Associations: Intact  Orientation:  Full (Time, Place, and Person)  Thought Content: WDL and Logical   Suicidal Thoughts:  No  Homicidal Thoughts:  No  Memory:  Immediate;   Good Recent;   Good Remote;   Good  Judgement:  Good  Insight:  Good  Psychomotor Activity:  Normal   Concentration:  Concentration: Good and Attention Span: Good  Recall:  Good  Fund of Knowledge: Good  Language: Good  Akathisia:  No  Handed:  Right  AIMS (if indicated): not done  Assets:  Communication Skills Desire for Improvement Financial Resources/Insurance Housing Social Support Transportation Vocational/Educational  ADL's:  Intact  Cognition: WNL  Sleep:  Good   Screenings: AUDIT    Flowsheet Row [facility]  Alcohol Use Disorder Identification Test Final Score (AUDIT) 0      GAD-7    Flowsheet Row Video Visit from 11/30/2022 in [facility] Video Visit from 09/28/2022 in [facility] Video Visit from 08/01/2022 in [facility] Clinical Support from 05/31/2022 in [facility] Office Visit from 04/19/2022 in [facility]  Total GAD-7 Score 2 3 7 14 18       PHQ2-9    Flowsheet Row Video Visit from 11/30/2022 in [facility] Video Visit from 09/28/2022 in [facility] Video Visit from 08/01/2022 in [facility] Clinical Support from 05/31/2022 in [facility] Office Visit from 04/19/2022 in [facility]  PHQ-2 Total Score 1 4 4 4 5   PHQ-9 Total Score -- 16 19 18 20       Flowsheet Row Video Visit from 11/30/2022 in [facility] Video Visit from 09/28/2022 in [facility] Video Visit from 08/01/2022 in [facility]  C-SSRS RISK CATEGORY Moderate Risk Moderate Risk Moderate Risk        Assessment and Plan:   Ralyn Bodle. Sylvia Gray is a 22 year old, Hispanic female with a past psychiatric history significant for borderline personality  disorder, sleep disturbances, generalized anxiety disorder, and major depressive disorder who presents to [facility] via virtual video visit for follow-up and medication management.  Patient reports no issues or concerns regarding her current medication regimen.  Patient denies experiencing any adverse side effects and further denies the need for dosage adjustments at this time.  Patient endorses good mood and denies depressive symptoms nor does she endorse anxiety.  Patient would like to continue taking her medications as prescribed.  Patient's medications to be e-prescribed to pharmacy of choice.  Collaboration of Care: Collaboration of Care: Medication Management AEB provider managing patient's psychiatric  medications, Psychiatrist AEB patient being followed by mental health provider at this facility, and Referral or follow-up with counselor/therapist AEB patient being seen by a licensed clinical social worker at this facility  Patient/Guardian was advised Release of Information must be obtained prior to any record release in order to collaborate their care with an outside provider. Patient/Guardian was advised if they have not already done so to contact the registration department to sign all necessary forms in order for [place] to release information regarding their care.   Consent: Patient/Guardian gives verbal consent for treatment and assignment of benefits for services provided during this visit. Patient/Guardian expressed understanding and agreed to proceed.   1. Sleep disturbances  - traZODone (DESYREL) 50 MG tablet; Take 1 tablet (50 mg total) by mouth at bedtime.  Dispense: 30 tablet; Refill: 3  2. GAD (generalized anxiety disorder)  - hydrOXYzine (ATARAX) 10 MG tablet; Take 1 tablet (10 mg total) by mouth 3 (three) times daily as needed.  Dispense: 75 tablet; Refill: 1 - sertraline (ZOLOFT) 100 MG tablet; TAKE 2 TABLETS(200 MG) BY MOUTH DAILY   Dispense: 60 tablet; Refill: 3  3. Severe episode of recurrent major depressive disorder, without psychotic features (HCC)  - sertraline (ZOLOFT) 100 MG tablet; TAKE 2 TABLETS(200 MG) BY MOUTH DAILY  Dispense: 60 tablet; Refill: 3  Patient to follow-up in 3 months Provider spent a total of 6 minutes with the patient/reviewing patient's chart  Meta Hatchet, PA 11/30/2022, 6:13 PM

## 2022-12-18 ENCOUNTER — Other Ambulatory Visit: Payer: Self-pay

## 2022-12-18 ENCOUNTER — Emergency Department (HOSPITAL_COMMUNITY)
Admission: EM | Admit: 2022-12-18 | Discharge: 2022-12-18 | Disposition: A | Discharge status: Home / Self Care | Payer: Medicaid Other | Source: Home / Self Care | Attending: Emergency Medicine | Admitting: Emergency Medicine

## 2022-12-18 ENCOUNTER — Encounter (HOSPITAL_COMMUNITY): Payer: Self-pay | Admitting: Emergency Medicine

## 2022-12-18 DIAGNOSIS — R197 Diarrhea, unspecified: Secondary | ICD-10-CM | POA: Diagnosis not present

## 2022-12-18 DIAGNOSIS — R112 Nausea with vomiting, unspecified: Secondary | ICD-10-CM | POA: Diagnosis present

## 2022-12-18 DIAGNOSIS — F10120 Alcohol abuse with intoxication, uncomplicated: Secondary | ICD-10-CM | POA: Insufficient documentation

## 2022-12-18 LAB — URINALYSIS, ROUTINE W REFLEX MICROSCOPIC
Bilirubin Urine: NEGATIVE
Glucose, UA: NEGATIVE mg/dL
Hgb urine dipstick: NEGATIVE
Ketones, ur: 20 mg/dL — AB
Leukocytes,Ua: NEGATIVE
Nitrite: NEGATIVE
Protein, ur: NEGATIVE mg/dL
Specific Gravity, Urine: 1.016 (ref 1.005–1.030)
pH: 7 (ref 5.0–8.0)

## 2022-12-18 LAB — COMPREHENSIVE METABOLIC PANEL
ALT: 18 U/L (ref 0–44)
AST: 28 U/L (ref 15–41)
Albumin: 4.9 g/dL (ref 3.5–5.0)
Alkaline Phosphatase: 68 U/L (ref 38–126)
Anion gap: 14 (ref 5–15)
BUN: 13 mg/dL (ref 6–20)
CO2: 20 mmol/L — ABNORMAL LOW (ref 22–32)
Calcium: 9.2 mg/dL (ref 8.9–10.3)
Chloride: 108 mmol/L (ref 98–111)
Creatinine, Ser: 0.67 mg/dL (ref 0.44–1.00)
GFR, Estimated: >60 mL/min (ref >60)
Glucose, Bld: 150 mg/dL — ABNORMAL HIGH (ref 70–99)
Potassium: 3.6 mmol/L (ref 3.5–5.1)
Sodium: 142 mmol/L (ref 135–145)
Total Bilirubin: 0.6 mg/dL (ref 0.3–1.2)
Total Protein: 8.3 g/dL — ABNORMAL HIGH (ref 6.5–8.1)

## 2022-12-18 LAB — LIPASE, BLOOD: Lipase: 50 U/L (ref 11–51)

## 2022-12-18 LAB — CBC WITH DIFFERENTIAL/PLATELET
Abs Immature Granulocytes: 0.04 10*3/uL (ref 0.00–0.07)
Basophils Absolute: 0 10*3/uL (ref 0.0–0.1)
Basophils Relative: 0 %
Eosinophils Absolute: 0 10*3/uL (ref 0.0–0.5)
Eosinophils Relative: 0 %
HCT: 38.3 % (ref 36.0–46.0)
Hemoglobin: 12.7 g/dL (ref 12.0–15.0)
Immature Granulocytes: 0 %
Lymphocytes Relative: 12 %
Lymphs Abs: 1.3 10*3/uL (ref 0.7–4.0)
MCH: 31.6 pg (ref 26.0–34.0)
MCHC: 33.2 g/dL (ref 30.0–36.0)
MCV: 95.3 fL (ref 80.0–100.0)
Monocytes Absolute: 0.4 10*3/uL (ref 0.1–1.0)
Monocytes Relative: 3 %
Neutro Abs: 8.8 10*3/uL — ABNORMAL HIGH (ref 1.7–7.7)
Neutrophils Relative %: 85 %
Platelets: 256 10*3/uL (ref 150–400)
RBC: 4.02 MIL/uL (ref 3.87–5.11)
RDW: 13.6 % (ref 11.5–15.5)
WBC: 10.5 10*3/uL (ref 4.0–10.5)
nRBC: 0 % (ref 0.0–0.2)

## 2022-12-18 LAB — ETHANOL: Alcohol, Ethyl (B): 92 mg/dL — ABNORMAL HIGH (ref <10)

## 2022-12-18 MED ORDER — PANTOPRAZOLE SODIUM 20 MG PO TBEC: 20.0000 mg | DELAYED_RELEASE_TABLET | Freq: Every day | ORAL | 0 refills | Status: AC

## 2022-12-18 MED ORDER — LACTATED RINGERS IV BOLUS
1000.0000 mL | Freq: Once | INTRAVENOUS | Status: AC
  Administered 2022-12-18: 1000 mL via INTRAVENOUS

## 2022-12-18 MED ORDER — ONDANSETRON HCL 4 MG/2ML IJ SOLN
4.0000 mg | Freq: Once | INTRAMUSCULAR | Status: AC
  Administered 2022-12-18: 4 mg via INTRAVENOUS
  Filled 2022-12-18: qty 2

## 2022-12-18 MED ORDER — ONDANSETRON HCL 4 MG PO TABS: 4.0000 mg | ORAL_TABLET | Freq: Four times a day (QID) | ORAL | 0 refills | Status: AC | PRN

## 2022-12-18 MED ORDER — PANTOPRAZOLE SODIUM 40 MG IV SOLR
40.0000 mg | Freq: Once | INTRAVENOUS | Status: AC
  Administered 2022-12-18: 40 mg via INTRAVENOUS
  Filled 2022-12-18: qty 10

## 2022-12-18 MED ORDER — PROMETHAZINE (PHENERGAN) 6.25MG IN NS 50ML IVPB
6.2500 mg | INTRAVENOUS | Status: AC
  Administered 2022-12-18: 6.25 mg via INTRAVENOUS
  Filled 2022-12-18: qty 6.25

## 2022-12-18 MED ORDER — SUCRALFATE 1 G PO TABS: 1.0000 g | ORAL_TABLET | Freq: Three times a day (TID) | ORAL | 0 refills | Status: AC

## 2022-12-18 NOTE — ED Triage Notes (Signed)
Pt BIB [facility] from home, c/o abdominal pain this morning after heavy alcohol consumption the previous night. Nausea/vomiting x 10-15 times. Denies marijuana use.   BP 100/80 P 70 SpO2 100% CBG 173

## 2022-12-18 NOTE — ED Provider Notes (Signed)
[place] EMERGENCY DEPARTMENT AT [facility] Provider Note   CSN: [id number] Arrival date & time: 12/18/22  2841     History  Chief Complaint  Patient presents with   Abdominal Pain   Nausea   Emesis    Somaly Jochem Kinsey Scull is a 22 y.o. female with history of BPD, bipolar 1 disorder, depression, anxiety.  Patient presents to ED for evaluation of nausea, vomiting, EtOH abuse.  Patient reports that yesterday she had "6 shots of Patron" and reports that beginning this morning she has had 10 episodes of nonbloody nonbilious emesis.  She reports history of the same, this is confirmed on chart review.  She is endorsing nausea, vomiting, diarrhea, abdominal pain.  She denies fevers, chest pain, shortness of breath, dysuria, flank pain.  Patient reports her last menstrual cycle was last month.  Denies concern for pregnancy.  Denies marijuana use, other illicit drug use.  Denies history of alcohol withdrawal, hospitalization for alcohol use.   Abdominal Pain Associated symptoms: diarrhea, nausea and vomiting   Associated symptoms: no chest pain, no dysuria, no fever and no shortness of breath   Emesis Associated symptoms: abdominal pain and diarrhea   Associated symptoms: no fever        Home Medications Prior to Admission medications   Medication Sig Start Date End Date Taking? Authorizing Provider  ondansetron (ZOFRAN) 4 MG tablet Take 1 tablet (4 mg total) by mouth every 6 (six) hours as needed for nausea or vomiting. 12/18/22  Yes Al Decant, PA-C  pantoprazole (PROTONIX) 20 MG tablet Take 1 tablet (20 mg total) by mouth daily. 12/18/22  Yes Al Decant, PA-C  sucralfate (CARAFATE) 1 g tablet Take 1 tablet (1 g total) by mouth 4 (four) times daily -  with meals and at bedtime. 12/18/22  Yes Al Decant, PA-C  amoxicillin-clavulanate (AUGMENTIN) 875-125 MG tablet Take 1 tablet by mouth every 12 (twelve) hours. 05/06/22   Smoot, Shawn Route, PA-C   clotrimazole (GYNE-LOTRIMIN) 1 % vaginal cream Place 1 Applicatorful vaginally at bedtime. 10/22/21   Carlyn Reichert, MD  hydrOXYzine (ATARAX) 10 MG tablet Take 1 tablet (10 mg total) by mouth 3 (three) times daily as needed. 11/30/22   Nwoko, Tommas Olp, PA  metroNIDAZOLE (FLAGYL) 500 MG tablet Take 1 tablet (500 mg total) by mouth 2 (two) times daily. 10/25/21   Henderly, Britni A, PA-C  naproxen (NAPROSYN) 500 MG tablet Take 1 tablet (500 mg total) by mouth 2 (two) times daily. 10/25/21   Henderly, Britni A, PA-C  ondansetron (ZOFRAN-ODT) 4 MG disintegrating tablet Take 1 tablet (4 mg total) by mouth every 8 (eight) hours as needed for nausea or vomiting. 05/06/22   Smoot, Shawn Route, PA-C  ondansetron (ZOFRAN-ODT) 8 MG disintegrating tablet Take 1 tablet (8 mg total) by mouth every 8 (eight) hours as needed for nausea or vomiting. 06/14/22   Valetta Close, MD  sertraline (ZOLOFT) 100 MG tablet TAKE 2 TABLETS(200 MG) BY MOUTH DAILY 11/30/22   Nwoko, Tommas Olp, PA  traZODone (DESYREL) 50 MG tablet Take 1 tablet (50 mg total) by mouth at bedtime. 11/30/22   Nwoko, Uchenna E, PA  VIENVA 0.1-20 MG-MCG tablet Take 1 tablet by mouth daily. 08/22/21   [provider]  omeprazole (PRILOSEC) 40 MG capsule Take 1 capsule (40 mg total) by mouth daily. 03/05/19 04/12/19  Dartha Lodge, PA-C      Allergies    Apple juice, Kiwi extract, Strawberry extract, and  Lamotrigine    Review of Systems   Review of Systems  Constitutional:  Negative for fever.  Respiratory:  Negative for shortness of breath.   Cardiovascular:  Negative for chest pain.  Gastrointestinal:  Positive for abdominal pain, diarrhea, nausea and vomiting.  Genitourinary:  Negative for dysuria and flank pain.  All other systems reviewed and are negative.   Physical Exam Updated Vital Signs BP 107/69 (BP Location: Right Arm)   Pulse 88   Temp 97.9 F (36.6 C) (Oral)   Resp 16   SpO2 100%  Physical Exam Vitals and nursing note  reviewed.  Constitutional:      General: She is not in acute distress.    Appearance: Normal appearance. She is not ill-appearing, toxic-appearing or diaphoretic.  HENT:     Head: Normocephalic and atraumatic.     Nose: Nose normal.     Mouth/Throat:     Mouth: Mucous membranes are moist.     Pharynx: Oropharynx is clear.  Eyes:     Extraocular Movements: Extraocular movements intact.     Conjunctiva/sclera: Conjunctivae normal.     Pupils: Pupils are equal, round, and reactive to light.  Cardiovascular:     Rate and Rhythm: Normal rate and regular rhythm.  Pulmonary:     Effort: Pulmonary effort is normal.     Breath sounds: Normal breath sounds. No wheezing.  Abdominal:     General: Abdomen is flat. Bowel sounds are normal.     Palpations: Abdomen is soft.     Tenderness: There is abdominal tenderness. There is no right CVA tenderness or left CVA tenderness.     Comments: Nonfocal abdominal tenderness without guarding, without rebound, without overlying skin change  Musculoskeletal:     Cervical back: Normal range of motion and neck supple. No tenderness.  Skin:    General: Skin is warm and dry.     Capillary Refill: Capillary refill takes less than 2 seconds.  Neurological:     Mental Status: She is alert and oriented to person, place, and time.     Comments: 5 out of 5 strength bilateral lower extremities.  Patient follows commands.     ED Results / Procedures / Treatments   Labs (all labs ordered are listed, but only abnormal results are displayed) Labs Reviewed  CBC WITH DIFFERENTIAL/PLATELET - Abnormal; Notable for the following components:      Result Value   Neutro Abs 8.8 (*)    All other components within normal limits  COMPREHENSIVE METABOLIC PANEL - Abnormal; Notable for the following components:   CO2 20 (*)    Glucose, Bld 150 (*)    Total Protein 8.3 (*)    All other components within normal limits  URINALYSIS, ROUTINE W REFLEX MICROSCOPIC - Abnormal;  Notable for the following components:   Ketones, ur 20 (*)    All other components within normal limits  ETHANOL - Abnormal; Notable for the following components:   Alcohol, Ethyl (B) 92 (*)    All other components within normal limits  LIPASE, BLOOD    EKG EKG Interpretation Date/Time:  Tuesday December 18 2022 10:32:19 EDT Ventricular Rate:  73 PR Interval:  149 QRS Duration:  100 QT Interval:  428 QTC Calculation: 472 R Axis:   74  Text Interpretation: Sinus rhythm RSR' in V1 or V2, right VCD or RVH Confirmed by Alvester Chou [phone]) on 12/18/2022 10:37:16 AM  Radiology No results found.  Procedures Procedures   Medications Ordered in ED  Medications  ondansetron (ZOFRAN) injection 4 mg (4 mg Intravenous Given 12/18/22 0754)  lactated ringers bolus 1,000 mL (1,000 mLs Intravenous New Bag/Given 12/18/22 0754)  lactated ringers bolus 1,000 mL (0 mLs Intravenous Paused 12/18/22 1105)  pantoprazole (PROTONIX) injection 40 mg (40 mg Intravenous Given 12/18/22 1038)  promethazine (PHENERGAN) 6.25 mg/NS 50 mL IVPB (6.25 mg Intravenous New Bag/Given 12/18/22 1104)    ED Course/ Medical Decision Making/ A&P Clinical Course as of 12/18/22 1153  Tue Dec 18, 2022  1118 Patient noted to be sticking finger down her throat in attempt to vomit. [CG]    Clinical Course User Index [CG] Al Decant, PA-C   Medical Decision Making Amount and/or Complexity of Data Reviewed Labs: ordered.  Risk Prescription drug management.   22 year old female presents to the ED for evaluation.  Please see HPI for further details.  On examination the patient is afebrile and nontachycardic.  Her lung sounds are clear bilaterally, she is nonhypoxic.  Abdomen is soft and compressible throughout, no overlying skin change, no rebound or guarding however she does endorse generalized abdominal tenderness.  Negative CVA tenderness bilaterally.  Neurological examination at baseline, oriented, follows  commands appropriately.  Will collect labs to include CBC, CMP, urinalysis, lipase, ethanol, EKG.  Will provide initial dose of Zofran, LR 1 L.  Patient CBC without leukocytosis, no anemia.  Metabolic panel shows no electrolyte derangement, no elevated LFTs, anion gap 14, no elevated creatinine.  Patient ethanol is elevated at 92 however the patient does appear clinically sober.  Patient lipase WNL.  Patient urinalysis shows ketones.  On reassessment, patient continued to complain of nausea.  On my entrance into the room, patient was noted to be sticking fingers down her throat and attempting to throw up.  Asked patient why she was doing this, she stated that she felt as if something was "stuck" in her throat.  Provided 6.25 milligrams of Phenergan, 1 L LR, 40 mg Protonix.  At this time the patient will be discharged home.  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  Will send patient home with Carafate, Protonix.  Will have patient continue hydrating self at home.  Will also send patient home with Zofran.  Patient advised to discontinue use of alcohol.  Advised to follow-up with PCP.  Advised to return to the ED with any new or worsening signs or symptoms.  Discharge.   Final Clinical Impression(s) / ED Diagnoses Final diagnoses:  Hangover without complication (HCC)    Rx / DC Orders ED Discharge Orders          Ordered    sucralfate (CARAFATE) 1 g tablet  3 times daily with meals & bedtime        12/18/22 1152    pantoprazole (PROTONIX) 20 MG tablet  Daily        12/18/22 1152    ondansetron (ZOFRAN) 4 MG tablet  Every 6 hours PRN        12/18/22 1152              Al Decant, [place] 12/18/22 1153    Terald Sleeper, MD 12/18/22 1321

## 2022-12-18 NOTE — Discharge Instructions (Signed)
It was a pleasure taking part in your care today.  As discussed, your work was reassuring.  Please discontinue use of alcohol in the future.  I believe that you have alcohol gastritis signs when to go home with Protonix.  Please take this once a day for the next 10 days.  Please also take Carafate 3 times a day with a meal.  You may take Zofran every 6 hours as needed for nausea and vomiting.  Please read the attached guides concerning alcohol use disorder as well as alcohol misuse.  Please return to the ED with any new or worsening signs or symptoms.

## 2023-03-01 ENCOUNTER — Telehealth (INDEPENDENT_AMBULATORY_CARE_PROVIDER_SITE_OTHER): Payer: Medicaid Other | Admitting: Physician Assistant

## 2023-03-01 DIAGNOSIS — G479 Sleep disorder, unspecified: Secondary | ICD-10-CM

## 2023-03-01 DIAGNOSIS — F411 Generalized anxiety disorder: Secondary | ICD-10-CM

## 2023-03-01 DIAGNOSIS — F332 Major depressive disorder, recurrent severe without psychotic features: Secondary | ICD-10-CM

## 2023-03-01 MED ORDER — SERTRALINE HCL 25 MG PO TABS: ORAL_TABLET | ORAL | 0 refills | Status: DC

## 2023-03-01 MED ORDER — BUPROPION HCL ER (XL) 150 MG PO TB24
150.0000 mg | ORAL_TABLET | Freq: Every day | ORAL | 1 refills | Status: DC
Start: 2023-03-01 — End: 2023-04-19

## 2023-03-01 MED ORDER — TRAZODONE HCL 50 MG PO TABS
50.0000 mg | ORAL_TABLET | Freq: Every day | ORAL | 1 refills | Status: DC
Start: 2023-03-01 — End: 2023-04-19

## 2023-03-01 MED ORDER — HYDROXYZINE HCL 10 MG PO TABS
10.0000 mg | ORAL_TABLET | Freq: Three times a day (TID) | ORAL | 1 refills | Status: DC | PRN
Start: 2023-03-01 — End: 2023-04-19

## 2023-03-04 ENCOUNTER — Encounter (HOSPITAL_COMMUNITY): Payer: Self-pay | Admitting: Physician Assistant

## 2023-03-04 DIAGNOSIS — F332 Major depressive disorder, recurrent severe without psychotic features: Secondary | ICD-10-CM | POA: Diagnosis not present

## 2023-03-04 DIAGNOSIS — G479 Sleep disorder, unspecified: Secondary | ICD-10-CM | POA: Diagnosis not present

## 2023-03-04 DIAGNOSIS — F411 Generalized anxiety disorder: Secondary | ICD-10-CM | POA: Diagnosis not present

## 2023-03-04 NOTE — Progress Notes (Addendum)
BH MD/PA/NP OP Progress Note  Virtual Visit via Video Note  I connected with Sylvia Gray on 03/01/23 at  4:00 PM EST by a video enabled telemedicine application and verified that I am speaking with the correct person using two identifiers.  Location: Patient: Home Provider: Clinic   I discussed the limitations of evaluation and management by telemedicine and the availability of in person appointments. The patient expressed understanding and agreed to proceed.  Follow Up Instructions:   I discussed the assessment and treatment plan with the patient. The patient was provided an opportunity to ask questions and all were answered. The patient agreed with the plan and demonstrated an understanding of the instructions.   The patient was advised to call back or seek an in-person evaluation if the symptoms worsen or if the condition fails to improve as anticipated.  I provided 25 minutes of non-face-to-face time during this encounter.  Meta Hatchet, PA    03/01/2023 3:50 AM Sylvia Gray  MRN:  [id number]  Chief Complaint:  Chief Complaint  Patient presents with   Follow-up   Medication Management   HPI:   Sylvia Gray is a 22 year old, Hispanic female with a past psychiatric history significant for borderline personality disorder, sleep disturbances, generalized anxiety disorder, and major depressive disorder who presents to [facility] via virtual video visit for follow-up and medication management.  Patient is currently being managed on the following psychiatric medications:  Sertraline 200 mg daily Hydroxyzine 10 mg 3 times daily as needed Trazodone 50 mg at bedtime  Patient reports that her Zoloft stopped working in September.  Even though her Zoloft was not effective in managing her mood, patient reports that she continues to take the medication.  Patient endorses being very depressed and  states that she often does not want to leave her home.  She further endorses difficulty getting out of bed, lack of motivation, crying spells, and expecting activities of daily living such as showering and brushing her teeth.  She reports that her symptoms are slowly worsening.  Despite her depression, patient reports that her anxiety has been manageable.  A PHQ-9 screen was performed with the patient scoring a 20.  A GAD-7 screen was also performed with the patient scoring a 7.  Patient is alert and oriented x 4, calm, cooperative, and fully engaged in conversation during the encounter.  Patient reports that she is currently hanging in there.  Patient denies suicidal or homicidal ideations.  She further denies auditory or visual hallucinations and does not appear to be responding to internal/external stimuli.  Patient endorses fair sleep and receives on average 5 to 6 hours of sleep per night.  Patient endorses fair appetite and eats on average 1-2 meals per day.  Patient denies alcohol consumption, tobacco use, or illicit drug use.  Visit Diagnosis:    ICD-10-CM   1. GAD (generalized anxiety disorder)  F41.1 sertraline (ZOLOFT) 25 MG tablet    hydrOXYzine (ATARAX) 10 MG tablet    2. Severe episode of recurrent major depressive disorder, without psychotic features (HCC)  F33.2 sertraline (ZOLOFT) 25 MG tablet    buPROPion (WELLBUTRIN XL) 150 MG 24 hr tablet    3. Sleep disturbances  G47.9 traZODone (DESYREL) 50 MG tablet      Past Psychiatric History:  Borderline personality disorder Bipolar disorder Anxiety Depression  Past Medical History:  Past Medical History:  Diagnosis Date   Anxiety    Bipolar  1 disorder (HCC)    Borderline personality disorder (HCC)    on Zoloft   Depression    Medical history non-contributory     Past Surgical History:  Procedure Laterality Date   NO PAST SURGERIES     WISDOM TOOTH EXTRACTION      Family Psychiatric History:  Patient states that her  cousins on her father's side of the family suffer from depression. Patient believes that her cousins are not taking any medications at this time. Patient denies history of suicide attempt within her family. Patient denies history of homicide attempts within her family. Patient further denies substance abuse in her family.   Family History: History reviewed. No pertinent family history.  Social History:  Social History   Socioeconomic History   Marital status: Single    Spouse name: Not on file   Number of children: Not on file   Years of education: Not on file   Highest education level: Not on file  Occupational History   Not on file  Tobacco Use   Smoking status: Never   Smokeless tobacco: Never  Vaping Use   Vaping status: Every Day  Substance and Sexual Activity   Alcohol use: Yes    Comment: occ   Drug use: Never   Sexual activity: Yes    Birth control/protection: Pill  Other Topics Concern   Not on file  Social History Narrative   Not on file   Social Determinants of Health   Financial Resource Strain: Not on file  Food Insecurity: Not on file  Transportation Needs: Not on file  Physical Activity: Not on file  Stress: Not on file  Social Connections: Not on file    Allergies:  Allergies  Allergen Reactions   Apple Juice Swelling   Kiwi Extract Swelling   Strawberry Extract Swelling   Lamotrigine Rash    Urticaria started 2 weeks after starting lamotrigine    Metabolic Disorder Labs: Lab Results  Component Value Date   HGBA1C 4.6 (L) 10/18/2021   MPG 85.32 10/18/2021   No results found for: "PROLACTIN" Lab Results  Component Value Date   CHOL 217 (H) 10/18/2021   TRIG 52 10/18/2021   HDL 56 10/18/2021   CHOLHDL 3.9 10/18/2021   VLDL 10 10/18/2021   LDLCALC 151 (H) 10/18/2021   Lab Results  Component Value Date   TSH 0.319 (L) 10/18/2021    Therapeutic Level Labs: No results found for: "LITHIUM" No results found for: "VALPROATE" No results  found for: "CBMZ"  Current Medications: Current Outpatient Medications  Medication Sig Dispense Refill   buPROPion (WELLBUTRIN XL) 150 MG 24 hr tablet Take 1 tablet (150 mg total) by mouth daily. 30 tablet 1   amoxicillin-clavulanate (AUGMENTIN) 875-125 MG tablet Take 1 tablet by mouth every 12 (twelve) hours. 14 tablet 0   clotrimazole (GYNE-LOTRIMIN) 1 % vaginal cream Place 1 Applicatorful vaginally at bedtime. 45 g 0   hydrOXYzine (ATARAX) 10 MG tablet Take 1 tablet (10 mg total) by mouth 3 (three) times daily as needed. 75 tablet 1   metroNIDAZOLE (FLAGYL) 500 MG tablet Take 1 tablet (500 mg total) by mouth 2 (two) times daily. 14 tablet 0   naproxen (NAPROSYN) 500 MG tablet Take 1 tablet (500 mg total) by mouth 2 (two) times daily. 30 tablet 0   ondansetron (ZOFRAN) 4 MG tablet Take 1 tablet (4 mg total) by mouth every 6 (six) hours as needed for nausea or vomiting. 12 tablet 0   ondansetron (  ZOFRAN-ODT) 4 MG disintegrating tablet Take 1 tablet (4 mg total) by mouth every 8 (eight) hours as needed for nausea or vomiting. 20 tablet 0   ondansetron (ZOFRAN-ODT) 8 MG disintegrating tablet Take 1 tablet (8 mg total) by mouth every 8 (eight) hours as needed for nausea or vomiting. 10 tablet 0   pantoprazole (PROTONIX) 20 MG tablet Take 1 tablet (20 mg total) by mouth daily. 15 tablet 0   sertraline (ZOLOFT) 25 MG tablet Patient to take 1 tablet (25 mg total) for 3 days prior to discontinuing the medication. 3 tablet 0   sucralfate (CARAFATE) 1 g tablet Take 1 tablet (1 g total) by mouth 4 (four) times daily -  with meals and at bedtime. 30 tablet 0   traZODone (DESYREL) 50 MG tablet Take 1 tablet (50 mg total) by mouth at bedtime. 30 tablet 1   VIENVA 0.1-20 MG-MCG tablet Take 1 tablet by mouth daily.     No current facility-administered medications for this visit.     Musculoskeletal: Strength & Muscle Tone: within normal limits Gait & Station: normal Patient leans: N/A  Psychiatric  Specialty Exam: Review of Systems  Psychiatric/Behavioral:  Positive for dysphoric mood and sleep disturbance. Negative for decreased concentration, hallucinations, self-injury and suicidal ideas. The patient is not nervous/anxious and is not hyperactive.     There were no vitals taken for this visit.There is no height or weight on file to calculate BMI.  General Appearance: Casual  Eye Contact:  Good  Speech:  Clear and Coherent and Normal Rate  Volume:  Normal  Mood:  Depressed  Affect:  Appropriate  Thought Process:  Coherent, Goal Directed, and Descriptions of Associations: Intact  Orientation:  Full (Time, Place, and Person)  Thought Content: WDL and Logical   Suicidal Thoughts:  No  Homicidal Thoughts:  No  Memory:  Immediate;   Good Recent;   Good Remote;   Good  Judgement:  Good  Insight:  Good  Psychomotor Activity:  Normal  Concentration:  Concentration: Good and Attention Span: Good  Recall:  Good  Fund of Knowledge: Good  Language: Good  Akathisia:  No  Handed:  Right  AIMS (if indicated): not done  Assets:  Communication Skills Desire for Improvement Financial Resources/Insurance Housing Social Support Transportation Vocational/Educational  ADL's:  Intact  Cognition: WNL  Sleep:  Fair   Screenings: AUDIT    Flowsheet Row [facility]  Alcohol Use Disorder Identification Test Final Score (AUDIT) 0      GAD-7    Flowsheet Row Video Visit from 03/01/2023 in [facility] Video Visit from 11/30/2022 in [facility] Video Visit from 09/28/2022 in [facility] Video Visit from 08/01/2022 in [facility] Clinical Support from 05/31/2022 in [facility]  Total GAD-7 Score 7 2 3 7 14       PHQ2-9    Flowsheet Row Video Visit from 03/01/2023 in [facility] Video Visit from 11/30/2022 in [facility] Video Visit from 09/28/2022 in [facility] Video Visit from 08/01/2022 in [facility] Clinical Support from 05/31/2022 in [facility]  PHQ-2 Total Score 4 1 4 4 4   PHQ-9 Total Score 20 -- 16 19 18       Flowsheet Row Video Visit from 03/01/2023 in [facility] ED from 12/18/2022 in [facility] Department at [facility] Video Visit from 11/30/2022 in [facility]  C-SSRS RISK CATEGORY High Risk No Risk Moderate Risk        Assessment and Plan:   Anna-Marie Baumbach. Sahaana Tritten is a 22 year old, Hispanic female with a past psychiatric history significant for borderline personality disorder, sleep disturbances, generalized anxiety disorder, and major depressive disorder who presents to [facility] via virtual video visit for follow-up and medication management.  Patient presents to the encounter stating that her Zoloft has been minimally effective in managing her depression.  She reports that she has been experiencing the following depressive symptoms: difficulty getting out of bed, lack of motivation, crying spells, and neglecting activities of daily living.  Despite her worsening depression, patient states that her anxiety has been manageable.  Provider recommended patient be placed on Wellbutrin 150 mg daily for the management of her depressive symptoms.  While patient is taking Wellbutrin, provider instructed for patient to taper off Zoloft.  Patient was instructed to take Zoloft 150 mg for 3 days, followed by 100 mg for 3 days, followed by 50 mg for 3 days, followed by 25 mg for 3 more days before discontinuing the medication.  Patient vocalized understanding.  Patient's medications to be e-prescribed through  pharmacy of choice.  Collaboration of Care: Collaboration of Care: Medication Management AEB provider managing patient's psychiatric medications, Psychiatrist AEB patient being followed by mental health provider at this facility, and Referral or follow-up with counselor/therapist AEB patient being seen by a licensed clinical social worker at this facility  Patient/Guardian was advised Release of Information must be obtained prior to any record release in order to collaborate their care with an outside provider. Patient/Guardian was advised if they have not already done so to contact the registration department to sign all necessary forms in order for [place] to release information regarding their care.   Consent: Patient/Guardian gives verbal consent for treatment and assignment of benefits for services provided during this visit. Patient/Guardian expressed understanding and agreed to proceed.   1. GAD (generalized anxiety disorder)  - sertraline (ZOLOFT) 25 MG tablet; Patient to take 1 tablet (25 mg total) for 3 days prior to discontinuing the medication.  Dispense: 3 tablet; Refill: 0 - hydrOXYzine (ATARAX) 10 MG tablet; Take 1 tablet (10 mg total) by mouth 3 (three) times daily as needed.  Dispense: 75 tablet; Refill: 1  2. Severe episode of recurrent major depressive disorder, without psychotic features (HCC)  - sertraline (ZOLOFT) 25 MG tablet; Patient to take 1 tablet (25 mg total) for 3 days prior to discontinuing the medication.  Dispense: 3 tablet; Refill: 0 - buPROPion (WELLBUTRIN XL) 150 MG 24 hr tablet; Take 1 tablet (150 mg total) by mouth daily.  Dispense: 30 tablet; Refill: 1  3. Sleep disturbances  - traZODone (DESYREL) 50 MG tablet; Take 1 tablet (50 mg total) by mouth at bedtime.  Dispense: 30 tablet; Refill: 1  Patient to follow-up in 6 weeks Provider spent a total of 25 minutes with the patient/reviewing patient's chart  Meta Hatchet, PA 03/01/2023, 3:50 AM

## 2023-03-11 ENCOUNTER — Other Ambulatory Visit (HOSPITAL_COMMUNITY): Payer: Self-pay | Admitting: Physician Assistant

## 2023-03-11 DIAGNOSIS — F411 Generalized anxiety disorder: Secondary | ICD-10-CM

## 2023-03-11 DIAGNOSIS — F332 Major depressive disorder, recurrent severe without psychotic features: Secondary | ICD-10-CM

## 2023-03-12 ENCOUNTER — Other Ambulatory Visit (HOSPITAL_COMMUNITY): Payer: Self-pay | Admitting: Physician Assistant

## 2023-03-12 DIAGNOSIS — F332 Major depressive disorder, recurrent severe without psychotic features: Secondary | ICD-10-CM

## 2023-03-12 DIAGNOSIS — F411 Generalized anxiety disorder: Secondary | ICD-10-CM

## 2023-03-19 ENCOUNTER — Telehealth (HOSPITAL_COMMUNITY): Payer: Self-pay | Admitting: Physician Assistant

## 2023-03-22 ENCOUNTER — Encounter (HOSPITAL_COMMUNITY): Payer: Self-pay | Admitting: Physician Assistant

## 2023-04-19 ENCOUNTER — Telehealth (HOSPITAL_COMMUNITY): Payer: Medicaid Other | Admitting: Physician Assistant

## 2023-04-19 ENCOUNTER — Encounter (HOSPITAL_COMMUNITY): Payer: Self-pay | Admitting: Physician Assistant

## 2023-04-19 DIAGNOSIS — F332 Major depressive disorder, recurrent severe without psychotic features: Secondary | ICD-10-CM | POA: Diagnosis not present

## 2023-04-19 DIAGNOSIS — G479 Sleep disorder, unspecified: Secondary | ICD-10-CM | POA: Diagnosis not present

## 2023-04-19 DIAGNOSIS — F411 Generalized anxiety disorder: Secondary | ICD-10-CM

## 2023-04-19 MED ORDER — BUPROPION HCL ER (XL) 150 MG PO TB24: 150.0000 mg | ORAL_TABLET | Freq: Every day | ORAL | 1 refills | Status: AC

## 2023-04-19 MED ORDER — HYDROXYZINE HCL 10 MG PO TABS
10.0000 mg | ORAL_TABLET | Freq: Three times a day (TID) | ORAL | 1 refills | Status: DC | PRN
Start: 2023-04-19 — End: 2023-06-03

## 2023-04-19 MED ORDER — TRAZODONE HCL 50 MG PO TABS
50.0000 mg | ORAL_TABLET | Freq: Every day | ORAL | 1 refills | Status: DC
Start: 2023-04-19 — End: 2023-06-03

## 2023-04-19 NOTE — Progress Notes (Signed)
 [facility] MD/PA/NP OP Progress Note  Virtual Visit via Video Note  I connected with Sylvia Gray on 04/19/23 at  4:00 PM EST by a video enabled telemedicine application and verified that I am speaking with the correct person using two identifiers.  Location: Patient: Home Provider: Clinic   I discussed the limitations of evaluation and management by telemedicine and the availability of in person appointments. The patient expressed understanding and agreed to proceed.  Follow Up Instructions:   I discussed the assessment and treatment plan with the patient. The patient was provided an opportunity to ask questions and all were answered. The patient agreed with the plan and demonstrated an understanding of the instructions.   The patient was advised to call back or seek an in-person evaluation if the symptoms worsen or if the condition fails to improve as anticipated.  I provided 11 minutes of non-face-to-face time during this encounter.  Sylvia FORBES Bolster, PA    04/19/2023 10:04 PM Sylvia Gray  MRN:  [id number]  Chief Complaint:  Chief Complaint  Patient presents with   Follow-up   Medication Refill   HPI:   Sylvia Gray is a 23 year old, Hispanic female with a past psychiatric history significant for borderline personality disorder, sleep disturbances, generalized anxiety disorder, and major depressive disorder who presents to [facility] via virtual video visit for follow-up and medication management.  Patient is currently being managed on the following psychiatric medications:  Bupropion  (Wellbutrin  XL) 150 mg 24 tablet daily Hydroxyzine  10 mg 3 times daily as needed Trazodone  50 mg at bedtime  Patient presents to the encounter stating that she continues to experience stressors in her life but states that they have been manageable since being placed on Wellbutrin .  Since being placed on Wellbutrin ,  patient reports that her depressive symptoms have been much more manageable.  Patient also endorses minimal anxiety.  Patient's current stressors include paying rent and financial instability.  A PHQ-9 screen was performed with the patient scoring a 15.  A GAD-7 screen was also performed with the patient scoring a 9.  Patient is alert and oriented x 4, calm, cooperative, and fully engaged in conversation during the encounter.  Patient describes her mood is very stressed but managing.  Patient exhibits euthymic mood with congruent affect.  Patient denies suicidal or homicidal ideations.  She further denies auditory or visual hallucinations and does not appear to be responding to internal/external stimuli.  Patient endorses good sleep and receives on average 6 to 7 hours of sleep per night.  Patient endorses fair appetite and eats an average 1 meal per day.  Patient denies alcohol consumption, tobacco use, or illicit drug use.  Visit Diagnosis:    ICD-10-CM   1. Severe episode of recurrent major depressive disorder, without psychotic features (HCC)  F33.2 buPROPion  (WELLBUTRIN  XL) 150 MG 24 hr tablet    2. Sleep disturbances  G47.9 traZODone  (DESYREL ) 50 MG tablet    3. GAD (generalized anxiety disorder)  F41.1 hydrOXYzine  (ATARAX ) 10 MG tablet      Past Psychiatric History:  Borderline personality disorder Bipolar disorder Anxiety Depression  Past Medical History:  Past Medical History:  Diagnosis Date   Anxiety    Bipolar 1 disorder (HCC)    Borderline personality disorder (HCC)    on Zoloft    Depression    Medical history non-contributory     Past Surgical History:  Procedure Laterality Date   NO PAST SURGERIES  WISDOM TOOTH EXTRACTION      Family Psychiatric History:  Patient states that her cousins on her father's side of the family suffer from depression. Patient believes that her cousins are not taking any medications at this time. Patient denies history of suicide  attempt within her family. Patient denies history of homicide attempts within her family. Patient further denies substance abuse in her family.   Family History: History reviewed. No pertinent family history.  Social History:  Social History   Socioeconomic History   Marital status: Single    Spouse name: Not on file   Number of children: Not on file   Years of education: Not on file   Highest education level: Not on file  Occupational History   Not on file  Tobacco Use   Smoking status: Never   Smokeless tobacco: Never  Vaping Use   Vaping status: Every Day  Substance and Sexual Activity   Alcohol use: Yes    Comment: occ   Drug use: Never   Sexual activity: Yes    Birth control/protection: Pill  Other Topics Concern   Not on file  Social History Narrative   Not on file   Social Drivers of Health   Financial Resource Strain: Not on file  Food Insecurity: Not on file  Transportation Needs: Not on file  Physical Activity: Not on file  Stress: Not on file  Social Connections: Not on file    Allergies:  Allergies  Allergen Reactions   Apple Juice Swelling   Kiwi Extract Swelling   Strawberry Extract Swelling   Lamotrigine Rash    Urticaria started 2 weeks after starting lamotrigine    Metabolic Disorder Labs: Lab Results  Component Value Date   HGBA1C 4.6 (L) 10/18/2021   MPG 85.32 10/18/2021   No results found for: PROLACTIN Lab Results  Component Value Date   CHOL 217 (H) 10/18/2021   TRIG 52 10/18/2021   HDL 56 10/18/2021   CHOLHDL 3.9 10/18/2021   VLDL 10 10/18/2021   LDLCALC 151 (H) 10/18/2021   Lab Results  Component Value Date   TSH 0.319 (L) 10/18/2021    Therapeutic Level Labs: No results found for: LITHIUM No results found for: VALPROATE No results found for: CBMZ  Current Medications: Current Outpatient Medications  Medication Sig Dispense Refill   amoxicillin -clavulanate (AUGMENTIN ) 875-125 MG tablet Take 1 tablet by  mouth every 12 (twelve) hours. 14 tablet 0   buPROPion  (WELLBUTRIN  XL) 150 MG 24 hr tablet Take 1 tablet (150 mg total) by mouth daily. 30 tablet 1   clotrimazole  (GYNE-LOTRIMIN ) 1 % vaginal cream Place 1 Applicatorful vaginally at bedtime. 45 g 0   hydrOXYzine  (ATARAX ) 10 MG tablet Take 1 tablet (10 mg total) by mouth 3 (three) times daily as needed. 75 tablet 1   metroNIDAZOLE  (FLAGYL ) 500 MG tablet Take 1 tablet (500 mg total) by mouth 2 (two) times daily. 14 tablet 0   naproxen  (NAPROSYN ) 500 MG tablet Take 1 tablet (500 mg total) by mouth 2 (two) times daily. 30 tablet 0   ondansetron  (ZOFRAN ) 4 MG tablet Take 1 tablet (4 mg total) by mouth every 6 (six) hours as needed for nausea or vomiting. 12 tablet 0   ondansetron  (ZOFRAN -ODT) 4 MG disintegrating tablet Take 1 tablet (4 mg total) by mouth every 8 (eight) hours as needed for nausea or vomiting. 20 tablet 0   ondansetron  (ZOFRAN -ODT) 8 MG disintegrating tablet Take 1 tablet (8 mg total) by mouth every 8 (  eight) hours as needed for nausea or vomiting. 10 tablet 0   pantoprazole  (PROTONIX ) 20 MG tablet Take 1 tablet (20 mg total) by mouth daily. 15 tablet 0   sucralfate  (CARAFATE ) 1 g tablet Take 1 tablet (1 g total) by mouth 4 (four) times daily -  with meals and at bedtime. 30 tablet 0   traZODone  (DESYREL ) 50 MG tablet Take 1 tablet (50 mg total) by mouth at bedtime. 30 tablet 1   VIENVA 0.1-20 MG-MCG tablet Take 1 tablet by mouth daily.     No current facility-administered medications for this visit.     Musculoskeletal: Strength & Muscle Tone: within normal limits Gait & Station: normal Patient leans: N/A  Psychiatric Specialty Exam: Review of Systems  Psychiatric/Behavioral:  Positive for sleep disturbance. Negative for decreased concentration, dysphoric mood, hallucinations, self-injury and suicidal ideas. The patient is not nervous/anxious and is not hyperactive.     There were no vitals taken for this visit.There is no  height or weight on file to calculate BMI.  General Appearance: Casual  Eye Contact:  Good  Speech:  Clear and Coherent and Normal Rate  Volume:  Normal  Mood:  Depressed  Affect:  Appropriate  Thought Process:  Coherent, Goal Directed, and Descriptions of Associations: Intact  Orientation:  Full (Time, Place, and Person)  Thought Content: WDL and Logical   Suicidal Thoughts:  No  Homicidal Thoughts:  No  Memory:  Immediate;   Good Recent;   Good Remote;   Good  Judgement:  Good  Insight:  Good  Psychomotor Activity:  Normal  Concentration:  Concentration: Good and Attention Span: Good  Recall:  Good  Fund of Knowledge: Good  Language: Good  Akathisia:  No  Handed:  Right  AIMS (if indicated): not done  Assets:  Communication Skills Desire for Improvement Financial Resources/Insurance Housing Social Support Transportation Vocational/Educational  ADL's:  Intact  Cognition: WNL  Sleep:  Fair   Screenings: AUDIT    Flowsheet Row [facility]  Alcohol Use Disorder Identification Test Final Score (AUDIT) 0      GAD-7    Flowsheet Row Video Visit from 04/19/2023 in [facility] Video Visit from 03/01/2023 in [facility] Video Visit from 11/30/2022 in [facility] Video Visit from 09/28/2022 in [facility] Video Visit from 08/01/2022 in [facility]  Total GAD-7 Score 9 7 2 3 7       PHQ2-9    Flowsheet Row Video Visit from 04/19/2023 in [facility] Video Visit from 03/01/2023 in [facility] Video Visit from 11/30/2022 in [facility] Video Visit from 09/28/2022 in [facility] Video Visit from 08/01/2022 in [facility]  PHQ-2  Total Score 3 4 1 4 4   PHQ-9 Total Score 15 20 -- 16 19      Flowsheet Row Video Visit from 04/19/2023 in [facility] Video Visit from 03/01/2023 in [facility] ED from 12/18/2022 in [facility] Department at [facility]  C-SSRS RISK CATEGORY Moderate Risk High Risk No Risk        Assessment and Plan:   Sylvia Gray is a 23 year old, Hispanic female with a past psychiatric history significant for borderline personality disorder, sleep disturbances, generalized anxiety disorder, and  major depressive disorder who presents to [facility] via virtual video visit for follow-up and medication management.  Patient presents to the encounter stating that Wellbutrin  has been effective in managing her depressive symptoms.  Although patient denies depressive symptoms, a PHQ-9 screen was performed with the patient scoring a 15.  Patient denies overt depressive symptoms at this time and states that her anxiety has been minimal.  Patient does not appear to be exhibiting depressive symptoms during the assessment.  Patient to continue taking her medications as prescribed and denies the need for dosage adjustments at this time.  Patient's medications to be e-prescribed to pharmacy of choice.  Collaboration of Care: Collaboration of Care: Medication Management AEB provider managing patient's psychiatric medications, Psychiatrist AEB patient being followed by mental health provider at this facility, and Referral or follow-up with counselor/therapist AEB patient being seen by a licensed clinical social worker at this facility  Patient/Guardian was advised Release of Information must be obtained prior to any record release in order to collaborate their care with an outside provider. Patient/Guardian was advised if they have not already done so to contact the registration department to sign  all necessary forms in order for us  to release information regarding their care.   Consent: Patient/Guardian gives verbal consent for treatment and assignment of benefits for services provided during this visit. Patient/Guardian expressed understanding and agreed to proceed.   1. Severe episode of recurrent major depressive disorder, without psychotic features (HCC)  - buPROPion  (WELLBUTRIN  XL) 150 MG 24 hr tablet; Take 1 tablet (150 mg total) by mouth daily.  Dispense: 30 tablet; Refill: 1  2. Sleep disturbances  - traZODone  (DESYREL ) 50 MG tablet; Take 1 tablet (50 mg total) by mouth at bedtime.  Dispense: 30 tablet; Refill: 1  3. GAD (generalized anxiety disorder)  - hydrOXYzine  (ATARAX ) 10 MG tablet; Take 1 tablet (10 mg total) by mouth 3 (three) times daily as needed.  Dispense: 75 tablet; Refill: 1  Patient to follow-up in 6 weeks Provider spent a total of 11 minutes with the patient/reviewing patient's chart  Sylvia FORBES Bolster, PA 04/19/2023, 10:04 PM

## 2023-04-21 ENCOUNTER — Other Ambulatory Visit (HOSPITAL_COMMUNITY): Payer: Self-pay | Admitting: Physician Assistant

## 2023-04-21 DIAGNOSIS — F332 Major depressive disorder, recurrent severe without psychotic features: Secondary | ICD-10-CM

## 2023-04-23 NOTE — Telephone Encounter (Signed)
 Message acknowledged and reviewed.

## 2023-05-31 ENCOUNTER — Telehealth (INDEPENDENT_AMBULATORY_CARE_PROVIDER_SITE_OTHER): Payer: Medicaid Other | Admitting: Physician Assistant

## 2023-05-31 DIAGNOSIS — F39 Unspecified mood [affective] disorder: Secondary | ICD-10-CM | POA: Diagnosis not present

## 2023-05-31 DIAGNOSIS — F411 Generalized anxiety disorder: Secondary | ICD-10-CM

## 2023-05-31 DIAGNOSIS — G479 Sleep disorder, unspecified: Secondary | ICD-10-CM

## 2023-06-03 ENCOUNTER — Encounter (HOSPITAL_COMMUNITY): Payer: Self-pay | Admitting: Physician Assistant

## 2023-06-03 DIAGNOSIS — G479 Sleep disorder, unspecified: Secondary | ICD-10-CM | POA: Insufficient documentation

## 2023-06-03 MED ORDER — DIVALPROEX SODIUM 500 MG PO DR TAB: 500.0000 mg | DELAYED_RELEASE_TABLET | Freq: Every day | ORAL | 1 refills | Status: DC

## 2023-06-03 MED ORDER — TRAZODONE HCL 50 MG PO TABS
50.0000 mg | ORAL_TABLET | Freq: Every day | ORAL | 1 refills | Status: DC
Start: 2023-06-03 — End: 2023-07-07

## 2023-06-03 MED ORDER — HYDROXYZINE HCL 10 MG PO TABS: 10.0000 mg | ORAL_TABLET | Freq: Three times a day (TID) | ORAL | 1 refills | Status: DC | PRN

## 2023-06-03 NOTE — Progress Notes (Signed)
BH MD/PA/NP OP Progress Note  Virtual Visit via Video Note  I connected with Sylvia Gray on 05/31/23 at  5:00 PM EST by a video enabled telemedicine application and verified that I am speaking with the correct person using two identifiers.  Location: Patient: Home Provider: Clinic   I discussed the limitations of evaluation and management by telemedicine and the availability of in person appointments. The patient expressed understanding and agreed to proceed.  Follow Up Instructions:   I discussed the assessment and treatment plan with the patient. The patient was provided an opportunity to ask questions and all were answered. The patient agreed with the plan and demonstrated an understanding of the instructions.   The patient was advised to call back or seek an in-person evaluation if the symptoms worsen or if the condition fails to improve as anticipated.  I provided 24 minutes of non-face-to-face time during this encounter.  Meta Hatchet, PA    05/31/2023 6:56 PM Sylvia Gray  MRN:  [id number]  Chief Complaint:  Chief Complaint  Patient presents with   Follow-up   Medication Management   HPI:   Sylvia Gray is a 23 year old, Hispanic female with a past psychiatric history significant for borderline personality disorder, sleep disturbances, generalized anxiety disorder, and major depressive disorder who presents to [facility] via virtual video visit for follow-up and medication management.  Patient is currently being managed on the following psychiatric medications:  Bupropion (Wellbutrin XL) 150 mg 24 tablet daily Hydroxyzine 10 mg 3 times daily as needed Trazodone 50 mg at bedtime  Patient reports that she feels that her bipolar has been more present lately.  In regards to bipolar disorder, patient reports that she has been experiencing really bad mood swings.  She states that she  will feel fine one minute then feel empty or start crying the next.  Patient also reports that she feels she is just living and does not feel like a person.  Patient endorses the following symptoms: increased energy, hyperactivity, increased talkativeness, and grandiosity.  Patient also denies getting good sleep and states that she has been spending money regularly.  She reports that she has a past history of bipolar disorder diagnosis back in 2017 and was given Prozac and Latuda for the management of her symptoms.  During that time, patient reports she had thoughts of wanting to harm herself as well as feeling sad and empty.  Patient endorses anxiety and states that she experiences anxiety roughly 3 to 4 days out of the week.  A PHQ-9 screen was performed with the patient scoring an 18.  A GAD-7 screen was also performed with the patient scoring a 12.  Patient is alert and oriented x 4, calm, cooperative, and fully engaged in conversation during the encounter.  Patient endorses neutral mood.  Patient denies suicidal or homicidal ideations.  She further denies auditory or visual hallucinations and does not appear to be responding to internal/external stimuli.  Patient endorses fair sleep and receives on average 4 to 5 hours of sleep per night.  Patient endorses fair appetite and eats on average 1 meal per day.  Patient denies alcohol consumption, tobacco use, or illicit drug use.  Visit Diagnosis:    ICD-10-CM   1. Sleep disturbances  G47.9 traZODone (DESYREL) 50 MG tablet    2. GAD (generalized anxiety disorder)  F41.1 hydrOXYzine (ATARAX) 10 MG tablet    3. Unspecified mood (affective) disorder (HCC)  F39 divalproex (DEPAKOTE) 500 MG DR tablet      Past Psychiatric History:  Borderline personality disorder Bipolar disorder Anxiety Depression  Past Medical History:  Past Medical History:  Diagnosis Date   Anxiety    Bipolar 1 disorder (HCC)    Borderline personality disorder (HCC)    on  Zoloft   Depression    Medical history non-contributory     Past Surgical History:  Procedure Laterality Date   NO PAST SURGERIES     WISDOM TOOTH EXTRACTION      Family Psychiatric History:  Patient states that her cousins on her father's side of the family suffer from depression. Patient believes that her cousins are not taking any medications at this time. Patient denies history of suicide attempt within her family. Patient denies history of homicide attempts within her family. Patient further denies substance abuse in her family.   Family History: History reviewed. No pertinent family history.  Social History:  Social History   Socioeconomic History   Marital status: Single    Spouse name: Not on file   Number of children: Not on file   Years of education: Not on file   Highest education level: Not on file  Occupational History   Not on file  Tobacco Use   Smoking status: Never   Smokeless tobacco: Never  Vaping Use   Vaping status: Every Day  Substance and Sexual Activity   Alcohol use: Yes    Comment: occ   Drug use: Never   Sexual activity: Yes    Birth control/protection: Pill  Other Topics Concern   Not on file  Social History Narrative   Not on file   Social Drivers of Health   Financial Resource Strain: Not on file  Food Insecurity: Not on file  Transportation Needs: Not on file  Physical Activity: Not on file  Stress: Not on file  Social Connections: Not on file    Allergies:  Allergies  Allergen Reactions   Apple Juice Swelling   Kiwi Extract Swelling   Strawberry Extract Swelling   Lamotrigine Rash    Urticaria started 2 weeks after starting lamotrigine    Metabolic Disorder Labs: Lab Results  Component Value Date   HGBA1C 4.6 (L) 10/18/2021   MPG 85.32 10/18/2021   No results found for: "PROLACTIN" Lab Results  Component Value Date   CHOL 217 (H) 10/18/2021   TRIG 52 10/18/2021   HDL 56 10/18/2021   CHOLHDL 3.9 10/18/2021    VLDL 10 10/18/2021   LDLCALC 151 (H) 10/18/2021   Lab Results  Component Value Date   TSH 0.319 (L) 10/18/2021    Therapeutic Level Labs: No results found for: "LITHIUM" No results found for: "VALPROATE" No results found for: "CBMZ"  Current Medications: Current Outpatient Medications  Medication Sig Dispense Refill   divalproex (DEPAKOTE) 500 MG DR tablet Take 1 tablet (500 mg total) by mouth daily. 30 tablet 1   amoxicillin-clavulanate (AUGMENTIN) 875-125 MG tablet Take 1 tablet by mouth every 12 (twelve) hours. 14 tablet 0   buPROPion (WELLBUTRIN XL) 150 MG 24 hr tablet Take 1 tablet (150 mg total) by mouth daily. 30 tablet 1   clotrimazole (GYNE-LOTRIMIN) 1 % vaginal cream Place 1 Applicatorful vaginally at bedtime. 45 g 0   hydrOXYzine (ATARAX) 10 MG tablet Take 1 tablet (10 mg total) by mouth 3 (three) times daily as needed. 75 tablet 1   metroNIDAZOLE (FLAGYL) 500 MG tablet Take 1 tablet (500 mg total) by  mouth 2 (two) times daily. 14 tablet 0   naproxen (NAPROSYN) 500 MG tablet Take 1 tablet (500 mg total) by mouth 2 (two) times daily. 30 tablet 0   ondansetron (ZOFRAN) 4 MG tablet Take 1 tablet (4 mg total) by mouth every 6 (six) hours as needed for nausea or vomiting. 12 tablet 0   ondansetron (ZOFRAN-ODT) 4 MG disintegrating tablet Take 1 tablet (4 mg total) by mouth every 8 (eight) hours as needed for nausea or vomiting. 20 tablet 0   ondansetron (ZOFRAN-ODT) 8 MG disintegrating tablet Take 1 tablet (8 mg total) by mouth every 8 (eight) hours as needed for nausea or vomiting. 10 tablet 0   pantoprazole (PROTONIX) 20 MG tablet Take 1 tablet (20 mg total) by mouth daily. 15 tablet 0   sucralfate (CARAFATE) 1 g tablet Take 1 tablet (1 g total) by mouth 4 (four) times daily -  with meals and at bedtime. 30 tablet 0   traZODone (DESYREL) 50 MG tablet Take 1 tablet (50 mg total) by mouth at bedtime. 30 tablet 1   VIENVA 0.1-20 MG-MCG tablet Take 1 tablet by mouth daily.     No  current facility-administered medications for this visit.     Musculoskeletal: Strength & Muscle Tone: within normal limits Gait & Station: normal Patient leans: N/A  Psychiatric Specialty Exam: Review of Systems  Psychiatric/Behavioral:  Positive for sleep disturbance. Negative for decreased concentration, dysphoric mood, hallucinations, self-injury and suicidal ideas. The patient is not nervous/anxious and is not hyperactive.     There were no vitals taken for this visit.There is no height or weight on file to calculate BMI.  General Appearance: Casual  Eye Contact:  Good  Speech:  Clear and Coherent and Normal Rate  Volume:  Normal  Mood:  Depressed  Affect:  Appropriate  Thought Process:  Coherent, Goal Directed, and Descriptions of Associations: Intact  Orientation:  Full (Time, Place, and Person)  Thought Content: WDL and Logical   Suicidal Thoughts:  No  Homicidal Thoughts:  No  Memory:  Immediate;   Good Recent;   Good Remote;   Good  Judgement:  Good  Insight:  Good  Psychomotor Activity:  Normal  Concentration:  Concentration: Good and Attention Span: Good  Recall:  Good  Fund of Knowledge: Good  Language: Good  Akathisia:  No  Handed:  Right  AIMS (if indicated): not done  Assets:  Communication Skills Desire for Improvement Financial Resources/Insurance Housing Social Support Transportation Vocational/Educational  ADL's:  Intact  Cognition: WNL  Sleep:  Fair   Screenings: AUDIT    Flowsheet Row [facility]  Alcohol Use Disorder Identification Test Final Score (AUDIT) 0      GAD-7    Flowsheet Row Video Visit from 05/31/2023 [facility] Video Visit from 04/19/2023 in [facility] Video Visit from 03/01/2023 in [facility] Video Visit from 11/30/2022 in [facility] Video Visit from 09/28/2022 in [facility]  Total GAD-7 Score 12 9 7 2 3       PHQ2-9    Flowsheet Row Video Visit from 05/31/2023 in [facility] Video Visit from 04/19/2023 in [facility] Video Visit from 03/01/2023 in [facility] Video Visit from 11/30/2022 in [facility] Video Visit from 09/28/2022 in [facility]  PHQ-2 Total Score 4 3 4 1 4   PHQ-9 Total Score 18 15 20  -- 16      Flowsheet Row Video Visit from 05/31/2023 in [facility] Video Visit from 04/19/2023 in [facility] Video Visit from 03/01/2023 in [facility]  C-SSRS RISK CATEGORY Moderate Risk Moderate Risk High Risk        Assessment and Plan:   Sylvia Gray is a 23 year old, Hispanic female with a past psychiatric history significant for borderline personality disorder, sleep disturbances, generalized anxiety disorder, and major depressive disorder who presents to [facility] via virtual video visit for follow-up and medication management.   Patient presents to the encounter stating that she has been experiencing what appears to be manic symptoms.  Patient endorses the following symptoms: increased energy, hyperactivity, increased talkativeness, financial extravagance, and grandiosity.  Patient reports that she has a history of being diagnosed with bipolar disorder in 2017 after she was hospitalized due to wanting to harm herself as well as feeling sad and empty.  Due to patient's manic symptoms, provider to adjust patient's diagnoses from major depressive disorder to unspecified (affective) mood disorder.  Patient use of Wellbutrin may have triggered manic symptoms within the patient.  Provider instructed patient  to discontinue her use of Wellbutrin.  Provider recommended patient start Depakote 500 mg at bedtime for the management of her mood.  Patient to continue taking all other medications as prescribed.  Patient was agreeable to recommendations.  Patient's medications to be e-prescribed through pharmacy of choice.  Collaboration of Care: Collaboration of Care: Medication Management AEB provider managing patient's psychiatric medications, Psychiatrist AEB patient being followed by mental health provider at this facility, and Referral or follow-up with counselor/therapist AEB patient being seen by a licensed clinical social worker at this facility  Patient/Guardian was advised Release of Information must be obtained prior to any record release in order to collaborate their care with an outside provider. Patient/Guardian was advised if they have not already done so to contact the registration department to sign all necessary forms in order for [place] to release information regarding their care.   Consent: Patient/Guardian gives verbal consent for treatment and assignment of benefits for services provided during this visit. Patient/Guardian expressed understanding and agreed to proceed.   1. Sleep disturbances (Primary)  - traZODone (DESYREL) 50 MG tablet; Take 1 tablet (50 mg total) by mouth at bedtime.  Dispense: 30 tablet; Refill: 1  2. GAD (generalized anxiety disorder)  - hydrOXYzine (ATARAX) 10 MG tablet; Take 1 tablet (10 mg total) by mouth 3 (three) times daily as needed.  Dispense: 75 tablet; Refill: 1  3. Unspecified mood (affective) disorder (HCC) Differential diagnosis: Bipolar disorder, bipolar 2 disorder, major depressive disorder  - divalproex (DEPAKOTE) 500 MG DR tablet; Take 1 tablet (500 mg total) by mouth daily.  Dispense: 30 tablet; Refill: 1  Patient to follow-up in 6 weeks Provider spent a total of 24 minutes with the patient/reviewing patient's chart  Meta Hatchet, PA 05/31/2023  6:56 PM

## 2023-07-05 ENCOUNTER — Telehealth (INDEPENDENT_AMBULATORY_CARE_PROVIDER_SITE_OTHER): Admitting: Physician Assistant

## 2023-07-05 DIAGNOSIS — F39 Unspecified mood [affective] disorder: Secondary | ICD-10-CM

## 2023-07-05 DIAGNOSIS — G479 Sleep disorder, unspecified: Secondary | ICD-10-CM | POA: Diagnosis not present

## 2023-07-05 DIAGNOSIS — Z79899 Other long term (current) drug therapy: Secondary | ICD-10-CM

## 2023-07-05 DIAGNOSIS — F411 Generalized anxiety disorder: Secondary | ICD-10-CM | POA: Diagnosis not present

## 2023-07-05 DIAGNOSIS — Z5181 Encounter for therapeutic drug level monitoring: Secondary | ICD-10-CM

## 2023-07-06 NOTE — Progress Notes (Unsigned)
 BH MD/PA/NP OP Progress Note  Virtual Visit via Video Note  I connected with Sylvia Gray on 07/06/23 at  4:30 PM EDT by a video enabled telemedicine application and verified that I am speaking with the correct person using two identifiers.  Location: Patient: Home Provider: Clinic   I discussed the limitations of evaluation and management by telemedicine and the availability of in person appointments. The patient expressed understanding and agreed to proceed.  Follow Up Instructions:   I discussed the assessment and treatment plan with the patient. The patient was provided an opportunity to ask questions and all were answered. The patient agreed with the plan and demonstrated an understanding of the instructions.   The patient was advised to call back or seek an in-person evaluation if the symptoms worsen or if the condition fails to improve as anticipated.  I provided *** minutes of non-face-to-face time during this encounter.  Meta Hatchet, PA    05/31/2023 6:56 PM Sylvia Gray  MRN:  [id number]  Chief Complaint:  No chief complaint on file.  HPI:   Sylvia Gray is a 23 year old, Hispanic female with a past psychiatric history significant for borderline personality disorder, sleep disturbances, generalized anxiety disorder, and major depressive disorder who presents to [facility] via virtual video visit for follow-up and medication management.  Patient is currently being managed on the following psychiatric medications:  Bupropion (Wellbutrin XL) 150 mg 24 tablet daily Hydroxyzine 10 mg 3 times daily as needed Trazodone 50 mg at bedtime  Patient reports that she feels that her bipolar has been more present lately.  In regards to bipolar disorder, patient reports that she has been experiencing really bad mood swings.  She states that she will feel fine one minute then feel empty or start  crying the next.  Patient also reports that she feels she is just living and does not feel like a person.  Patient endorses the following symptoms: increased energy, hyperactivity, increased talkativeness, and grandiosity.  Patient also denies getting good sleep and states that she has been spending money regularly.  She reports that she has a past history of bipolar disorder diagnosis back in 2017 and was given Prozac and Latuda for the management of her symptoms.  During that time, patient reports she had thoughts of wanting to harm herself as well as feeling sad and empty.  Patient endorses anxiety and states that she experiences anxiety roughly 3 to 4 days out of the week.  A PHQ-9 screen was performed with the patient scoring an 18.  A GAD-7 screen was also performed with the patient scoring a 12.  Patient is alert and oriented x 4, calm, cooperative, and fully engaged in conversation during the encounter.  Patient endorses neutral mood.  Patient denies suicidal or homicidal ideations.  She further denies auditory or visual hallucinations and does not appear to be responding to internal/external stimuli.  Patient endorses fair sleep and receives on average 4 to 5 hours of sleep per night.  Patient endorses fair appetite and eats on average 1 meal per day.  Patient denies alcohol consumption, tobacco use, or illicit drug use.  Visit Diagnosis:  No diagnosis found.   Past Psychiatric History:  Borderline personality disorder Bipolar disorder Anxiety Depression  Past Medical History:  Past Medical History:  Diagnosis Date   Anxiety    Bipolar 1 disorder (HCC)    Borderline personality disorder (HCC)    on  Zoloft   Depression    Medical history non-contributory     Past Surgical History:  Procedure Laterality Date   NO PAST SURGERIES     WISDOM TOOTH EXTRACTION      Family Psychiatric History:  Patient states that her cousins on her father's side of the family suffer from  depression. Patient believes that her cousins are not taking any medications at this time. Patient denies history of suicide attempt within her family. Patient denies history of homicide attempts within her family. Patient further denies substance abuse in her family.   Family History: No family history on file.  Social History:  Social History   Socioeconomic History   Marital status: Single    Spouse name: Not on file   Number of children: Not on file   Years of education: Not on file   Highest education level: Not on file  Occupational History   Not on file  Tobacco Use   Smoking status: Never   Smokeless tobacco: Never  Vaping Use   Vaping status: Every Day  Substance and Sexual Activity   Alcohol use: Yes    Comment: occ   Drug use: Never   Sexual activity: Yes    Birth control/protection: Pill  Other Topics Concern   Not on file  Social History Narrative   Not on file   Social Drivers of Health   Financial Resource Strain: Not on file  Food Insecurity: Not on file  Transportation Needs: Not on file  Physical Activity: Not on file  Stress: Not on file  Social Connections: Not on file    Allergies:  Allergies  Allergen Reactions   Apple Juice Swelling   Kiwi Extract Swelling   Strawberry Extract Swelling   Lamotrigine Rash    Urticaria started 2 weeks after starting lamotrigine    Metabolic Disorder Labs: Lab Results  Component Value Date   HGBA1C 4.6 (L) 10/18/2021   MPG 85.32 10/18/2021   No results found for: "PROLACTIN" Lab Results  Component Value Date   CHOL 217 (H) 10/18/2021   TRIG 52 10/18/2021   HDL 56 10/18/2021   CHOLHDL 3.9 10/18/2021   VLDL 10 10/18/2021   LDLCALC 151 (H) 10/18/2021   Lab Results  Component Value Date   TSH 0.319 (L) 10/18/2021    Therapeutic Level Labs: No results found for: "LITHIUM" No results found for: "VALPROATE" No results found for: "CBMZ"  Current Medications: Current Outpatient Medications   Medication Sig Dispense Refill   amoxicillin-clavulanate (AUGMENTIN) 875-125 MG tablet Take 1 tablet by mouth every 12 (twelve) hours. 14 tablet 0   buPROPion (WELLBUTRIN XL) 150 MG 24 hr tablet Take 1 tablet (150 mg total) by mouth daily. 30 tablet 1   clotrimazole (GYNE-LOTRIMIN) 1 % vaginal cream Place 1 Applicatorful vaginally at bedtime. 45 g 0   divalproex (DEPAKOTE) 500 MG DR tablet Take 1 tablet (500 mg total) by mouth daily. 30 tablet 1   hydrOXYzine (ATARAX) 10 MG tablet Take 1 tablet (10 mg total) by mouth 3 (three) times daily as needed. 75 tablet 1   metroNIDAZOLE (FLAGYL) 500 MG tablet Take 1 tablet (500 mg total) by mouth 2 (two) times daily. 14 tablet 0   naproxen (NAPROSYN) 500 MG tablet Take 1 tablet (500 mg total) by mouth 2 (two) times daily. 30 tablet 0   ondansetron (ZOFRAN) 4 MG tablet Take 1 tablet (4 mg total) by mouth every 6 (six) hours as needed for nausea or vomiting. 12  tablet 0   ondansetron (ZOFRAN-ODT) 4 MG disintegrating tablet Take 1 tablet (4 mg total) by mouth every 8 (eight) hours as needed for nausea or vomiting. 20 tablet 0   ondansetron (ZOFRAN-ODT) 8 MG disintegrating tablet Take 1 tablet (8 mg total) by mouth every 8 (eight) hours as needed for nausea or vomiting. 10 tablet 0   pantoprazole (PROTONIX) 20 MG tablet Take 1 tablet (20 mg total) by mouth daily. 15 tablet 0   sucralfate (CARAFATE) 1 g tablet Take 1 tablet (1 g total) by mouth 4 (four) times daily -  with meals and at bedtime. 30 tablet 0   traZODone (DESYREL) 50 MG tablet Take 1 tablet (50 mg total) by mouth at bedtime. 30 tablet 1   VIENVA 0.1-20 MG-MCG tablet Take 1 tablet by mouth daily.     No current facility-administered medications for this visit.     Musculoskeletal: Strength & Muscle Tone: within normal limits Gait & Station: normal Patient leans: N/A  Psychiatric Specialty Exam: Review of Systems  Psychiatric/Behavioral:  Positive for sleep disturbance. Negative for  decreased concentration, dysphoric mood, hallucinations, self-injury and suicidal ideas. The patient is not nervous/anxious and is not hyperactive.     There were no vitals taken for this visit.There is no height or weight on file to calculate BMI.  General Appearance: Casual  Eye Contact:  Good  Speech:  Clear and Coherent and Normal Rate  Volume:  Normal  Mood:  Depressed  Affect:  Appropriate  Thought Process:  Coherent, Goal Directed, and Descriptions of Associations: Intact  Orientation:  Full (Time, Place, and Person)  Thought Content: WDL and Logical   Suicidal Thoughts:  No  Homicidal Thoughts:  No  Memory:  Immediate;   Good Recent;   Good Remote;   Good  Judgement:  Good  Insight:  Good  Psychomotor Activity:  Normal  Concentration:  Concentration: Good and Attention Span: Good  Recall:  Good  Fund of Knowledge: Good  Language: Good  Akathisia:  No  Handed:  Right  AIMS (if indicated): not done  Assets:  Communication Skills Desire for Improvement Financial Resources/Insurance Housing Social Support Transportation Vocational/Educational  ADL's:  Intact  Cognition: WNL  Sleep:  Fair   Screenings: AUDIT    Flowsheet Row [facility]  Alcohol Use Disorder Identification Test Final Score (AUDIT) 0      GAD-7    Flowsheet Row Video Visit from 07/05/2023 in [facility] Video Visit from 05/31/2023 in [facility] Video Visit from 04/19/2023 in [facility] Video Visit from 03/01/2023 in [facility] Video Visit from 11/30/2022 in [facility]  Total GAD-7 Score 1 12 9 7 2       PHQ2-9    Flowsheet Row Video Visit from 07/05/2023 in [facility] Video Visit from 05/31/2023 in [facility] Video Visit  from 04/19/2023 in [facility] Video Visit from 03/01/2023 in [facility] Video Visit from 11/30/2022 in [facility]  PHQ-2 Total Score 1 4 3 4 1   PHQ-9 Total Score -- 18 15 20  --      Flowsheet Row Video Visit from 07/05/2023 in [facility] Video Visit from 05/31/2023 in [facility] Video Visit from 04/19/2023 in [facility]  C-SSRS RISK CATEGORY Moderate Risk Moderate Risk Moderate Risk        Assessment and Plan:   Sylvia Gray is a 23 year old, Hispanic female with a past psychiatric history significant for borderline personality disorder, sleep disturbances, generalized anxiety disorder, and major depressive disorder who presents to [facility] via virtual video visit for follow-up and medication management. ***  Collaboration of Care: Collaboration of Care: Medication Management AEB provider managing patient's psychiatric medications, Psychiatrist AEB patient being followed by mental health provider at this facility, and Referral or follow-up with counselor/therapist AEB patient being seen by a licensed clinical social worker at this facility  Patient/Guardian was advised Release of Information must be obtained prior to any record release in order to collaborate their care with an outside provider. Patient/Guardian was advised if they have not already done so to contact the registration department to sign all necessary forms in order for [place] to release information regarding their care.   Consent: Patient/Guardian gives verbal consent for treatment and assignment of benefits for services provided during this visit. Patient/Guardian expressed understanding and agreed to proceed.   *** Patient to follow-up in 2 months Provider spent a total of *** minutes with the  patient/reviewing patient's chart  Meta Hatchet, PA 05/31/2023 6:56 PM

## 2023-07-07 ENCOUNTER — Encounter (HOSPITAL_COMMUNITY): Payer: Self-pay | Admitting: Physician Assistant

## 2023-07-07 DIAGNOSIS — Z79899 Other long term (current) drug therapy: Secondary | ICD-10-CM | POA: Insufficient documentation

## 2023-07-07 MED ORDER — DIVALPROEX SODIUM 500 MG PO DR TAB: 500.0000 mg | DELAYED_RELEASE_TABLET | Freq: Every day | ORAL | 1 refills | Status: DC

## 2023-07-07 MED ORDER — TRAZODONE HCL 50 MG PO TABS
50.0000 mg | ORAL_TABLET | Freq: Every day | ORAL | 1 refills | Status: DC
Start: 2023-07-07 — End: 2023-09-09

## 2023-07-07 MED ORDER — HYDROXYZINE HCL 10 MG PO TABS: 10.0000 mg | ORAL_TABLET | Freq: Three times a day (TID) | ORAL | 1 refills | Status: DC | PRN

## 2023-07-07 MED ORDER — DIVALPROEX SODIUM 500 MG PO DR TAB
500.0000 mg | DELAYED_RELEASE_TABLET | Freq: Every day | ORAL | 2 refills | Status: DC
Start: 2023-07-07 — End: 2023-09-09

## 2023-09-06 ENCOUNTER — Telehealth (HOSPITAL_COMMUNITY): Admitting: Physician Assistant

## 2023-09-06 DIAGNOSIS — F411 Generalized anxiety disorder: Secondary | ICD-10-CM | POA: Diagnosis not present

## 2023-09-06 DIAGNOSIS — G479 Sleep disorder, unspecified: Secondary | ICD-10-CM | POA: Diagnosis not present

## 2023-09-06 DIAGNOSIS — F39 Unspecified mood [affective] disorder: Secondary | ICD-10-CM

## 2023-09-06 DIAGNOSIS — Z79899 Other long term (current) drug therapy: Secondary | ICD-10-CM

## 2023-09-09 ENCOUNTER — Encounter (HOSPITAL_COMMUNITY): Payer: Self-pay | Admitting: Physician Assistant

## 2023-09-09 MED ORDER — TRAZODONE HCL 50 MG PO TABS: 50.0000 mg | ORAL_TABLET | Freq: Every day | ORAL | 1 refills | Status: AC

## 2023-09-09 MED ORDER — HYDROXYZINE HCL 10 MG PO TABS: 10.0000 mg | ORAL_TABLET | Freq: Three times a day (TID) | ORAL | 1 refills | Status: AC | PRN

## 2023-09-09 MED ORDER — DIVALPROEX SODIUM 500 MG PO DR TAB: 500.0000 mg | DELAYED_RELEASE_TABLET | Freq: Every day | ORAL | 2 refills | Status: DC

## 2023-09-09 NOTE — Progress Notes (Signed)
 BH MD/PA/NP OP Progress Note  Virtual Visit via Video Note  I connected with Sylvia Gray on 09/06/23 at  4:30 PM EDT by a video enabled telemedicine application and verified that I am speaking with the correct person using two identifiers.  Location: Patient: Home Provider: Clinic   I discussed the limitations of evaluation and management by telemedicine and the availability of in person appointments. The patient expressed understanding and agreed to proceed.  Follow Up Instructions:   I discussed the assessment and treatment plan with the patient. The patient was provided an opportunity to ask questions and all were answered. The patient agreed with the plan and demonstrated an understanding of the instructions.   The patient was advised to call back or seek an in-person evaluation if the symptoms worsen or if the condition fails to improve as anticipated.  I provided 17 minutes of non-face-to-face time during this encounter.  Gates Kasal, PA    09/06/2023 5:00 PM Sylvia Gray  MRN:  [id number]  Chief Complaint:  Chief Complaint  Patient presents with   Follow-up   Medication Refill   HPI:   Sylvia Gray. Sylvia Gray is a 23 year old, Hispanic female with a past psychiatric history significant for borderline personality disorder, sleep disturbances, generalized anxiety disorder, and major depressive disorder who presents to [facility] via virtual video visit for follow-up and medication management.  Patient is currently being managed on the following psychiatric medications:  Divalproex  500 mg DR tablet daily Hydroxyzine  10 mg 3 times daily as needed Trazodone  50 mg at bedtime  Patient presents to the encounter reporting no issues or concerns regarding her use of her medications.  Patient denies experiencing any adverse side effects at this time.  She reports that her depression has been  manageable but continues to experience some depression.  Patient rates her depression a 7 out of 10 with 10 being most severe.  Patient endorses depressive episodes 3 to 4 days out of the week.  Patient endorses the following depressive symptoms: feelings of sadness, decreased energy, irritability, and decreased concentration.  Patient denies lack of motivation, feelings of guilt/worthlessness, or hopelessness.  Patient states that her anxiety is manageable and rates her anxiety a 5 out of 10.  Patient denies any new stressors at this time.  A PHQ-9 screen was performed with the patient scoring an 8.  A GAD-7 screen was also performed with the patient scoring a 3.  Patient is alert and oriented x 4, calm, cooperative, and fully engaged in conversation during the encounter.  Patient endorses good mood.  Patient exhibits euthymic mood with appropriate affect.  Patient denies suicidal or homicidal ideations.  She further denies auditory or visual hallucinations and does not appear to be responding to internal/external stimuli.  Patient endorses good sleep and receives on average 8 hours of sleep per night.  Patient endorses good appetite needs on average 3 meals per day.  Patient denies alcohol consumption, tobacco use, or illicit drug use.  Visit Diagnosis:    ICD-10-CM   1. On valproic acid therapy  Z79.899     2. GAD (generalized anxiety disorder)  F41.1 hydrOXYzine  (ATARAX ) 10 MG tablet    3. Sleep disturbances  G47.9 traZODone  (DESYREL ) 50 MG tablet    4. Unspecified mood (affective) disorder (HCC)  F39 divalproex  (DEPAKOTE ) 500 MG DR tablet       Past Psychiatric History:  Borderline personality disorder Bipolar disorder Anxiety Depression  Past Medical History:  Past Medical History:  Diagnosis Date   Anxiety    Bipolar 1 disorder (HCC)    Borderline personality disorder (HCC)    on Zoloft    Depression    Medical history non-contributory     Past Surgical History:  Procedure  Laterality Date   NO PAST SURGERIES     WISDOM TOOTH EXTRACTION      Family Psychiatric History:  Patient states that her cousins on her father's side of the family suffer from depression. Patient believes that her cousins are not taking any medications at this time. Patient denies history of suicide attempt within her family. Patient denies history of homicide attempts within her family. Patient further denies substance abuse in her family.   Family History: History reviewed. No pertinent family history.  Social History:  Social History   Socioeconomic History   Marital status: Single    Spouse name: Not on file   Number of children: Not on file   Years of education: Not on file   Highest education level: Not on file  Occupational History   Not on file  Tobacco Use   Smoking status: Never   Smokeless tobacco: Never  Vaping Use   Vaping status: Every Day  Substance and Sexual Activity   Alcohol use: Yes    Comment: occ   Drug use: Never   Sexual activity: Yes    Birth control/protection: Pill  Other Topics Concern   Not on file  Social History Narrative   Not on file   Social Drivers of Health   Financial Resource Strain: Not on file  Food Insecurity: Not on file  Transportation Needs: Not on file  Physical Activity: Not on file  Stress: Not on file  Social Connections: Not on file    Allergies:  Allergies  Allergen Reactions   Apple Juice Swelling   Kiwi Extract Swelling   Strawberry Extract Swelling   Lamotrigine Rash    Urticaria started 2 weeks after starting lamotrigine    Metabolic Disorder Labs: Lab Results  Component Value Date   HGBA1C 4.6 (L) 10/18/2021   MPG 85.32 10/18/2021   No results found for: "PROLACTIN" Lab Results  Component Value Date   CHOL 217 (H) 10/18/2021   TRIG 52 10/18/2021   HDL 56 10/18/2021   CHOLHDL 3.9 10/18/2021   VLDL 10 10/18/2021   LDLCALC 151 (H) 10/18/2021   Lab Results  Component Value Date   TSH 0.319  (L) 10/18/2021    Therapeutic Level Labs: No results found for: "LITHIUM" No results found for: "VALPROATE" No results found for: "CBMZ"  Current Medications: Current Outpatient Medications  Medication Sig Dispense Refill   amoxicillin -clavulanate (AUGMENTIN ) 875-125 MG tablet Take 1 tablet by mouth every 12 (twelve) hours. 14 tablet 0   buPROPion  (WELLBUTRIN  XL) 150 MG 24 hr tablet Take 1 tablet (150 mg total) by mouth daily. 30 tablet 1   clotrimazole  (GYNE-LOTRIMIN ) 1 % vaginal cream Place 1 Applicatorful vaginally at bedtime. 45 g 0   divalproex  (DEPAKOTE ) 500 MG DR tablet Take 1 tablet (500 mg total) by mouth daily. 30 tablet 2   hydrOXYzine  (ATARAX ) 10 MG tablet Take 1 tablet (10 mg total) by mouth 3 (three) times daily as needed. 75 tablet 1   metroNIDAZOLE  (FLAGYL ) 500 MG tablet Take 1 tablet (500 mg total) by mouth 2 (two) times daily. 14 tablet 0   naproxen  (NAPROSYN ) 500 MG tablet Take 1 tablet (500 mg total) by mouth 2 (  two) times daily. 30 tablet 0   ondansetron  (ZOFRAN ) 4 MG tablet Take 1 tablet (4 mg total) by mouth every 6 (six) hours as needed for nausea or vomiting. 12 tablet 0   ondansetron  (ZOFRAN -ODT) 4 MG disintegrating tablet Take 1 tablet (4 mg total) by mouth every 8 (eight) hours as needed for nausea or vomiting. 20 tablet 0   ondansetron  (ZOFRAN -ODT) 8 MG disintegrating tablet Take 1 tablet (8 mg total) by mouth every 8 (eight) hours as needed for nausea or vomiting. 10 tablet 0   pantoprazole  (PROTONIX ) 20 MG tablet Take 1 tablet (20 mg total) by mouth daily. 15 tablet 0   sucralfate  (CARAFATE ) 1 g tablet Take 1 tablet (1 g total) by mouth 4 (four) times daily -  with meals and at bedtime. 30 tablet 0   traZODone  (DESYREL ) 50 MG tablet Take 1 tablet (50 mg total) by mouth at bedtime. 30 tablet 1   VIENVA 0.1-20 MG-MCG tablet Take 1 tablet by mouth daily.     No current facility-administered medications for this visit.     Musculoskeletal: Strength & Muscle  Tone: within normal limits Gait & Station: normal Patient leans: N/A  Psychiatric Specialty Exam: Review of Systems  Psychiatric/Behavioral:  Positive for dysphoric mood. Negative for decreased concentration, hallucinations, self-injury, sleep disturbance and suicidal ideas. The patient is nervous/anxious. The patient is not hyperactive.     There were no vitals taken for this visit.There is no height or weight on file to calculate BMI.  General Appearance: Casual  Eye Contact:  Good  Speech:  Clear and Coherent and Normal Rate  Volume:  Normal  Mood:  Anxious and Depressed  Affect:  Appropriate  Thought Process:  Coherent, Goal Directed, and Descriptions of Associations: Intact  Orientation:  Full (Time, Place, and Person)  Thought Content: WDL and Logical   Suicidal Thoughts:  No  Homicidal Thoughts:  No  Memory:  Immediate;   Good Recent;   Good Remote;   Good  Judgement:  Good  Insight:  Good  Psychomotor Activity:  Normal  Concentration:  Concentration: Good and Attention Span: Good  Recall:  Good  Fund of Knowledge: Good  Language: Good  Akathisia:  No  Handed:  Right  AIMS (if indicated): not done  Assets:  Communication Skills Desire for Improvement Financial Resources/Insurance Housing Social Support Transportation Vocational/Educational  ADL's:  Intact  Cognition: WNL  Sleep:  Good   Screenings: AUDIT    Flowsheet Row [facility]  Alcohol Use Disorder Identification Test Final Score (AUDIT) 0      GAD-7    Flowsheet Row Video Visit from 09/06/2023 in [facility] Video Visit from 07/05/2023 in [facility] Video Visit from 05/31/2023 in [facility] Video Visit from 04/19/2023 in [facility] Video Visit from 03/01/2023 in [facility]  Total  GAD-7 Score 3 1 12 9 7       PHQ2-9    Flowsheet Row Video Visit from 09/06/2023 in [facility] Video Visit from 07/05/2023 in [facility] Video Visit from 05/31/2023 in [facility] Video Visit from 04/19/2023 in [facility] Visit from 03/01/2023 in [facility]  PHQ-2 Total Score 2 1 4 3 4   PHQ-9 Total Score 8 -- 18 15 20  Flowsheet Row Video Visit from 09/06/2023 in [facility] Video Visit from 07/05/2023 in [facility] Video Visit from 05/31/2023 in [facility]  C-SSRS RISK CATEGORY Moderate Risk Moderate Risk Moderate Risk        Assessment and Plan:   Rasheda Ledger. Maycee Blasco is a 23 year old, Hispanic female with a past psychiatric history significant for borderline personality disorder, sleep disturbances, generalized anxiety disorder, and major depressive disorder who presents to [facility] via virtual video visit for follow-up and medication management.  Patient presents to the encounter stating that she has been taking her medications regularly.  She continues to endorse depression but states that her symptoms have been manageable.  Patient also endorses anxiety but states that her symptoms have been manageable.  A PHQ-9 screen was performed with the patient scoring an 8.  A GAD-7 screen was also performed with the patient scoring a 3.  Despite her ongoing depression and anxiety, patient would like to continue taking her medications as prescribed.  Patient's medications to be e-prescribed to pharmacy of choice.  A [place] Suicide Severity Rating Scale was performed with the patient being considered moderate risk.  Patient denies suicidal ideations and is able to contract for safety following the conclusion of  the encounter.  Labs pending.  Provider to obtain a Depakote  level from the patient due to patient taking Depakote .  Collaboration of Care: Collaboration of Care: Medication Management AEB provider managing patient's psychiatric medications, Psychiatrist AEB patient being followed by mental health provider at this facility, and Referral or follow-up with counselor/therapist AEB patient being seen by a licensed clinical social worker at this facility  Patient/Guardian was advised Release of Information must be obtained prior to any record release in order to collaborate their care with an outside provider. Patient/Guardian was advised if they have not already done so to contact the registration department to sign all necessary forms in order for us  to release information regarding their care.   Consent: Patient/Guardian gives verbal consent for treatment and assignment of benefits for services provided during this visit. Patient/Guardian expressed understanding and agreed to proceed.   1. GAD (generalized anxiety disorder)  - hydrOXYzine  (ATARAX ) 10 MG tablet; Take 1 tablet (10 mg total) by mouth 3 (three) times daily as needed.  Dispense: 75 tablet; Refill: 1  2. Sleep disturbances  - traZODone  (DESYREL ) 50 MG tablet; Take 1 tablet (50 mg total) by mouth at bedtime.  Dispense: 30 tablet; Refill: 1  3. Unspecified mood (affective) disorder (HCC)  - divalproex  (DEPAKOTE ) 500 MG DR tablet; Take 1 tablet (500 mg total) by mouth daily.  Dispense: 30 tablet; Refill: 2  4. On valproic acid therapy (Primary) Labs pending  Patient to follow-up in 6 weeks Provider spent a total of 17 minutes with the patient/reviewing patient's chart  Gates Kasal, PA 09/06/2023, 5:00 PM

## 2023-09-19 ENCOUNTER — Other Ambulatory Visit: Payer: Self-pay

## 2023-09-19 ENCOUNTER — Encounter (HOSPITAL_BASED_OUTPATIENT_CLINIC_OR_DEPARTMENT_OTHER): Payer: Self-pay | Admitting: Emergency Medicine

## 2023-09-19 DIAGNOSIS — Z5321 Procedure and treatment not carried out due to patient leaving prior to being seen by health care provider: Secondary | ICD-10-CM | POA: Diagnosis not present

## 2023-09-19 DIAGNOSIS — R1010 Upper abdominal pain, unspecified: Secondary | ICD-10-CM | POA: Insufficient documentation

## 2023-09-19 DIAGNOSIS — R110 Nausea: Secondary | ICD-10-CM | POA: Diagnosis not present

## 2023-09-19 DIAGNOSIS — R079 Chest pain, unspecified: Secondary | ICD-10-CM | POA: Insufficient documentation

## 2023-09-19 LAB — CBC
HCT: 36 % (ref 36.0–46.0)
Hemoglobin: 12.2 g/dL (ref 12.0–15.0)
MCH: 33.1 pg (ref 26.0–34.0)
MCHC: 33.9 g/dL (ref 30.0–36.0)
MCV: 97.6 fL (ref 80.0–100.0)
Platelets: 171 10*3/uL (ref 150–400)
RBC: 3.69 MIL/uL — ABNORMAL LOW (ref 3.87–5.11)
RDW: 13.3 % (ref 11.5–15.5)
WBC: 5 10*3/uL (ref 4.0–10.5)
nRBC: 0 % (ref 0.0–0.2)

## 2023-09-19 LAB — COMPREHENSIVE METABOLIC PANEL WITH GFR
ALT: <5 U/L (ref 0–44)
AST: 13 U/L — ABNORMAL LOW (ref 15–41)
Albumin: 4 g/dL (ref 3.5–5.0)
Alkaline Phosphatase: 77 U/L (ref 38–126)
Anion gap: 11 (ref 5–15)
BUN: 17 mg/dL (ref 6–20)
CO2: 25 mmol/L (ref 22–32)
Calcium: 9.7 mg/dL (ref 8.9–10.3)
Chloride: 103 mmol/L (ref 98–111)
Creatinine, Ser: 0.6 mg/dL (ref 0.44–1.00)
GFR, Estimated: >60 mL/min (ref >60)
Glucose, Bld: 102 mg/dL — ABNORMAL HIGH (ref 70–99)
Potassium: 3.7 mmol/L (ref 3.5–5.1)
Sodium: 139 mmol/L (ref 135–145)
Total Bilirubin: 0.2 mg/dL (ref 0.0–1.2)
Total Protein: 6.8 g/dL (ref 6.5–8.1)

## 2023-09-19 LAB — LIPASE, BLOOD: Lipase: 25 U/L (ref 11–51)

## 2023-09-19 NOTE — ED Triage Notes (Signed)
 sharp left side upper abdo/chest pain pain radiates into the back  Some nausea  X couple days

## 2023-09-20 ENCOUNTER — Emergency Department (HOSPITAL_BASED_OUTPATIENT_CLINIC_OR_DEPARTMENT_OTHER)
Admission: EM | Admit: 2023-09-20 | Discharge: 2023-09-20 | Discharge status: Against Medical Advice | Attending: Emergency Medicine | Admitting: Emergency Medicine

## 2023-09-20 NOTE — ED Notes (Signed)
 No answer when calling to treatment room. Unable to locate patient in waiting area.

## 2023-10-21 ENCOUNTER — Telehealth (HOSPITAL_COMMUNITY): Payer: Self-pay

## 2023-10-21 ENCOUNTER — Other Ambulatory Visit (HOSPITAL_COMMUNITY): Payer: Self-pay | Admitting: Physician Assistant

## 2023-10-21 DIAGNOSIS — F39 Unspecified mood [affective] disorder: Secondary | ICD-10-CM

## 2023-10-21 MED ORDER — DIVALPROEX SODIUM 500 MG PO DR TAB
500.0000 mg | DELAYED_RELEASE_TABLET | Freq: Two times a day (BID) | ORAL | 1 refills | Status: AC
Start: 2023-10-21 — End: ?

## 2023-10-21 NOTE — Progress Notes (Signed)
 Provider was contacted by Ja'Bron N Lewis, CMA regarding patient's worsening symptoms.  Provider was able to contact patient regarding concerns.  Patient informed provider that she has been having mental breakdowns along with suicidal thoughts.  She denies wanting to act on these thoughts but states that they have been ongoing for the past 2 weeks.  She reports that she still takes her medication regularly but feels that it has been minimally effective.  Provider recommended increasing patient's Depakote  dosage from 500 mg daily to 500 mg 3 times daily for mood stability.  Patient was agreeable to recommendation.  Patient's medication to be prescribed to pharmacy of choice.  Provider informed patient to follow up with [facility] in the event of a mental health crisis.  Patient was also instructed to contact the ED in the event of a mental health crisis.  Patient vocalized understanding.

## 2023-10-21 NOTE — Telephone Encounter (Signed)
 Hello,    Pt called today stating that she would like to speak to provider to see if she can get prescribed something stronger.  Pt states that she is feeling  like meds are not working what so ever and that complications are worsening.    JNL.

## 2023-10-21 NOTE — Telephone Encounter (Signed)
 Message acknowledged and reviewed. Provider was able to reach out to patient regarding her medication concerns. See note.

## 2023-11-01 ENCOUNTER — Other Ambulatory Visit: Payer: Self-pay | Admitting: Medical Genetics

## 2023-11-06 ENCOUNTER — Ambulatory Visit (HOSPITAL_COMMUNITY)
Admission: EM | Admit: 2023-11-06 | Discharge: 2023-11-06 | Disposition: A | Discharge status: Home / Self Care | Attending: Psychiatry | Admitting: Psychiatry

## 2023-11-06 DIAGNOSIS — Z79899 Other long term (current) drug therapy: Secondary | ICD-10-CM

## 2023-11-06 DIAGNOSIS — R251 Tremor, unspecified: Secondary | ICD-10-CM | POA: Insufficient documentation

## 2023-11-06 MED ORDER — ATENOLOL 25 MG PO TABS
12.5000 mg | ORAL_TABLET | Freq: Once | ORAL | Status: AC
  Administered 2023-11-06: 12.5 mg via ORAL
  Filled 2023-11-06: qty 1

## 2023-11-06 NOTE — Discharge Instructions (Signed)
 F/u with oupatient provider for medication change

## 2023-11-06 NOTE — ED Provider Notes (Signed)
 Behavioral Health Urgent Care Medical Screening Exam  Patient Name: Sylvia Gray MRN: [id number] Date of Evaluation: 11/07/23 Chief Complaint: want a medication charge  Diagnosis:  Final diagnoses:  Medication course changed  Tremor    History of Present illness: Sylvia Gray is a 23 y.o. female. With a history of GD, MDD, SI presented to [facility], voluntarily.  Per the patient she wants to change her medication to something else.  According to her she is prescribed Depakote  and she has not taken it in 2 weeks.  When asked why she is not taking the medicine patient stated that she does not like the medicine at all according to the patient she has been taking this medicine for 3 months and at one point it was not working so her provider increased the dose but she stopped taking it 2 weeks ago.  According to the patient she noted she has been having hand tremors.  Patient stated that she currently works and does not have time to come in.  When asked if she had contacted her provider patient stated she did last week.  Writer discussed with patient that she would need to come in and speak with her provider at the walk-in psychiatry for him to change her medication.  However Clinical research associate discussed with patient that I could give her a dose of atenolol  for the tremors but she need to come in tomorrow morning to talk with her provider.  Face-to-face evaluation of patient, patient is alert and oriented x 4, speech is clear, maintain eye contact.  Patient denies SI, HI, AVH or paranoia.  Denies alcohol use.  Denies illicit drug use.  Denies smoking.  Patient at this present moment does not seem to be influenced by internal or external stimuli.  Patient does not pose a risk to herself or others.  Writer discussed with patient that should she experience any suicidal thoughts homicidal ideation or hallucination she should call 911 or return to the nearest ED.  Patient verbalized  understanding.  Will give patient's atenolol  10 mg for tremors.  Patient to follow up with her provider in the walk-in psychiatry tomorrow.  Comment discharge the patient to follow up with outpatient provider.  Flowsheet Row ED from 11/06/2023 in [facility] ED from 09/20/2023 in [facility] Emergency Department at [facility] Video Visit from 09/06/2023 in [facility]  C-SSRS RISK CATEGORY No Risk No Risk Moderate Risk    Psychiatric Specialty Exam  Presentation  General Appearance:Casual  Eye Contact:Good  Speech:Clear and Coherent  Speech Volume:Normal  Handedness:Right   Mood and Affect  Mood: Anxious  Affect: Congruent   Thought Process  Thought Processes: Coherent  Descriptions of Associations:Intact  Orientation:Full (Time, Place and Person)  Thought Content:Logical  Diagnosis of Schizophrenia or Schizoaffective disorder in past: No data recorded  Hallucinations:None  Ideas of Reference:None  Suicidal Thoughts:No  Homicidal Thoughts:No   Sensorium  Memory: Immediate Fair  Judgment: Fair  Insight: Fair   Art therapist  Concentration: Fair  Attention Span: Fair  Recall: [employer] of Knowledge: Fair  Language: Fair   Psychomotor Activity  Psychomotor Activity: Normal   Assets  Assets: Desire for Improvement   Sleep  Sleep: Fair  Number of hours:  6   Physical Exam: Physical Exam HENT:     Head: Normocephalic.     Nose: Nose normal.  Eyes:     Pupils: Pupils are equal, round, and reactive to  light.  Cardiovascular:     Rate and Rhythm: Normal rate.  Pulmonary:     Effort: Pulmonary effort is normal.  Musculoskeletal:        General: Normal range of motion.     Cervical back: Normal range of motion.  Neurological:     General: No focal deficit present.     Mental Status: She is alert.  Psychiatric:        Mood and Affect: Mood normal.         Thought Content: Thought content normal.    Review of Systems  Constitutional: Negative.   HENT: Negative.    Eyes: Negative.   Respiratory: Negative.    Cardiovascular: Negative.   Skin: Negative.   Neurological: Negative.   Psychiatric/Behavioral:  The patient is nervous/anxious.    Blood pressure 94/64, pulse 69, temperature 98.7 F (37.1 C), temperature source Oral, resp. rate 18, SpO2 100%. There is no height or weight on file to calculate BMI.  Musculoskeletal: Strength & Muscle Tone: within normal limits Gait & Station: normal Patient leans: N/A   [facility] MSE Discharge Disposition for Follow up and Recommendations: Based on my evaluation the patient does not appear to have an emergency medical condition and can be discharged with resources and follow up care in outpatient services for Medication Management   Gaither Pouch, NP 11/07/2023, 4:49 AM
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
## 2023-11-08 ENCOUNTER — Telehealth (HOSPITAL_COMMUNITY): Payer: Self-pay

## 2023-11-08 ENCOUNTER — Other Ambulatory Visit (HOSPITAL_COMMUNITY): Payer: Self-pay | Admitting: Physician Assistant

## 2023-11-08 DIAGNOSIS — F39 Unspecified mood [affective] disorder: Secondary | ICD-10-CM

## 2023-11-08 MED ORDER — CARIPRAZINE HCL 1.5 MG PO CAPS: 1.5000 mg | ORAL_CAPSULE | Freq: Every day | ORAL | 1 refills | Status: AC

## 2023-11-08 NOTE — Telephone Encounter (Signed)
 Patient called in reporting her divalproex  (DEPAKOTE ) 500 MG DR tablet isn't working and is experiencing maniac symptoms. Patient would like follow up communication with provider to discuss other options.

## 2023-11-08 NOTE — Telephone Encounter (Signed)
 Message acknowledged and reviewed.  Provider was able to reach out to patient to discuss medication options.  See note.

## 2023-11-08 NOTE — Progress Notes (Signed)
 Provider was able to reach out to patient regarding concerns over her current symptoms.  Per note, patient has been experiencing manic symptoms and is no longer taking her Depakote  at this time.  Provider was able to reach out to patient to discuss her concerns.  Patient informed provider that she had recently presented to [facility] due to wanting to change her current medication regimen.  She reports that she had informed staff that she had stopped taking her Depakote  for the past 2 weeks.  She also states that the medication was causing her to have tremors.  Patient was given atenolol  for the management of her tremors experienced from her use of Depakote .  During the discussion with the patient, patient reports that she feels that she is going to obtain and endorses the following symptoms: hyperverbal, increased energy, and decreased need for sleep.  She also informed provider that she has been experiencing depression that has prevented her from going outside or going to work.  She reports that she is no longer taking Depakote  and that her use of Depakote  made her feel scared and anxious.  In addition to her anxiety, patient endorses worthlessness.  Patient is most concerned about feeling that she is going crazy.  Patient has been on Abilify and Lamictal in the past and states that the medications were not helpful in managing her past symptoms.  Provider recommended patient be placed on Vraylar 1.5 mg daily for mood stability.  Patient was agreeable to recommendation.  Provider whenever side effects associated with the use of the medication prior to the conclusion of the encounter.  Patient verbalized understanding.  Patient's medication to be e-prescribed to pharmacy of choice.

## 2023-11-12 ENCOUNTER — Telehealth (HOSPITAL_COMMUNITY): Payer: Self-pay

## 2023-11-12 NOTE — Telephone Encounter (Signed)
 Prior Authorization was dent over by [facility].  Prior Authorization was completed and approved for Vraylar  1.5 mg Capsule until 11/11/2024

## 2023-12-16 ENCOUNTER — Other Ambulatory Visit: Payer: Self-pay

## 2023-12-16 ENCOUNTER — Emergency Department (HOSPITAL_COMMUNITY)
Admission: EM | Admit: 2023-12-16 | Discharge: 2023-12-16 | Disposition: A | Discharge status: Home / Self Care | Attending: Emergency Medicine | Admitting: Emergency Medicine

## 2023-12-16 ENCOUNTER — Encounter (HOSPITAL_COMMUNITY): Payer: Self-pay

## 2023-12-16 DIAGNOSIS — R519 Headache, unspecified: Secondary | ICD-10-CM | POA: Diagnosis present

## 2023-12-16 DIAGNOSIS — R55 Syncope and collapse: Secondary | ICD-10-CM

## 2023-12-16 DIAGNOSIS — G43809 Other migraine, not intractable, without status migrainosus: Secondary | ICD-10-CM | POA: Diagnosis not present

## 2023-12-16 LAB — BASIC METABOLIC PANEL WITH GFR
Anion gap: 10 (ref 5–15)
BUN: 17 mg/dL (ref 6–20)
CO2: 26 mmol/L (ref 22–32)
Calcium: 9.1 mg/dL (ref 8.9–10.3)
Chloride: 104 mmol/L (ref 98–111)
Creatinine, Ser: 0.54 mg/dL (ref 0.44–1.00)
GFR, Estimated: >60 mL/min (ref >60)
Glucose, Bld: 100 mg/dL — ABNORMAL HIGH (ref 70–99)
Potassium: 3.9 mmol/L (ref 3.5–5.1)
Sodium: 141 mmol/L (ref 135–145)

## 2023-12-16 LAB — CBC
HCT: 36.9 % (ref 36.0–46.0)
Hemoglobin: 12.3 g/dL (ref 12.0–15.0)
MCH: 32.7 pg (ref 26.0–34.0)
MCHC: 33.3 g/dL (ref 30.0–36.0)
MCV: 98.1 fL (ref 80.0–100.0)
Platelets: 192 K/uL (ref 150–400)
RBC: 3.76 MIL/uL — ABNORMAL LOW (ref 3.87–5.11)
RDW: 12.1 % (ref 11.5–15.5)
WBC: 5.3 K/uL (ref 4.0–10.5)
nRBC: 0 % (ref 0.0–0.2)

## 2023-12-16 LAB — HCG, SERUM, QUALITATIVE: Preg, Serum: NEGATIVE

## 2023-12-16 MED ORDER — DIPHENHYDRAMINE HCL 50 MG/ML IJ SOLN
12.5000 mg | Freq: Once | INTRAMUSCULAR | Status: AC
  Administered 2023-12-16: 12.5 mg via INTRAVENOUS
  Filled 2023-12-16: qty 1

## 2023-12-16 MED ORDER — SODIUM CHLORIDE 0.9 % IV BOLUS
1000.0000 mL | Freq: Once | INTRAVENOUS | Status: AC
  Administered 2023-12-16: 1000 mL via INTRAVENOUS

## 2023-12-16 MED ORDER — KETOROLAC TROMETHAMINE 30 MG/ML IJ SOLN
30.0000 mg | Freq: Once | INTRAMUSCULAR | Status: AC
  Administered 2023-12-16: 30 mg via INTRAVENOUS
  Filled 2023-12-16: qty 1

## 2023-12-16 MED ORDER — METOCLOPRAMIDE HCL 5 MG/ML IJ SOLN
10.0000 mg | Freq: Once | INTRAMUSCULAR | Status: AC
  Administered 2023-12-16: 10 mg via INTRAVENOUS
  Filled 2023-12-16: qty 2

## 2023-12-16 NOTE — ED Triage Notes (Signed)
 Pt presents to ED from home C/O migraine X 3 days, episode at work where she felt body temperature drop, went pale, and felt faint. Denies LOC.

## 2023-12-16 NOTE — ED Provider Notes (Signed)
 [place] EMERGENCY DEPARTMENT AT [facility] Provider Note   CSN: [id number] Arrival date & time: 12/16/23  1322     Patient presents with: Headache   Sylvia Gray is a 23 y.o. female with a history of depression presenting to ED with concern for dizzy spell.  Patient reports she woke up feeling normal today.  She has she had been at work, already on her feet, she began to feel lightheaded, dizzy, says she became pale and sweaty, and then fell like she was going a pass out.  She was nauseated the time.  She reports this never happened to her before.  She reports that after this episode she began to have a gradual onset headache.  She does suffer from migraine headaches and this is an atypical pattern which is predominantly on the left side of her temple, behind her left eye, with some blurred vision.  She says this is typical for her migraines.  She also feels she does not drink much water from a day-to-day basis.  She reports the only medication she is taking regularly is her Vraylar .   HPI     Prior to Admission medications   Medication Sig Start Date End Date Taking? Authorizing Provider  amoxicillin -clavulanate (AUGMENTIN ) 875-125 MG tablet Take 1 tablet by mouth every 12 (twelve) hours. 05/06/22   Smoot, Lauraine LABOR, PA-C  buPROPion  (WELLBUTRIN  XL) 150 MG 24 hr tablet Take 1 tablet (150 mg total) by mouth daily. 04/19/23   Nwoko, Uchenna E, PA  cariprazine  (VRAYLAR ) 1.5 MG capsule Take 1 capsule (1.5 mg total) by mouth daily. 11/08/23   Nwoko, Uchenna E, PA  clotrimazole  (GYNE-LOTRIMIN ) 1 % vaginal cream Place 1 Applicatorful vaginally at bedtime. 10/22/21   Marry Clamp, MD  divalproex  (DEPAKOTE ) 500 MG DR tablet Take 1 tablet (500 mg total) by mouth 2 (two) times daily. 10/21/23   Nwoko, Uchenna E, PA  hydrOXYzine  (ATARAX ) 10 MG tablet Take 1 tablet (10 mg total) by mouth 3 (three) times daily as needed. 09/09/23   Nwoko, Uchenna E, PA  metroNIDAZOLE  (FLAGYL ) 500 MG  tablet Take 1 tablet (500 mg total) by mouth 2 (two) times daily. 10/25/21   Henderly, Britni A, PA-C  naproxen  (NAPROSYN ) 500 MG tablet Take 1 tablet (500 mg total) by mouth 2 (two) times daily. 10/25/21   Henderly, Britni A, PA-C  ondansetron  (ZOFRAN ) 4 MG tablet Take 1 tablet (4 mg total) by mouth every 6 (six) hours as needed for nausea or vomiting. 12/18/22   Ruthell Lonni FALCON, PA-C  ondansetron  (ZOFRAN -ODT) 4 MG disintegrating tablet Take 1 tablet (4 mg total) by mouth every 8 (eight) hours as needed for nausea or vomiting. 05/06/22   Smoot, Lauraine LABOR, PA-C  ondansetron  (ZOFRAN -ODT) 8 MG disintegrating tablet Take 1 tablet (8 mg total) by mouth every 8 (eight) hours as needed for nausea or vomiting. 06/14/22   Macario Dorothyann CHRISTELLA, MD  pantoprazole  (PROTONIX ) 20 MG tablet Take 1 tablet (20 mg total) by mouth daily. 12/18/22   Ruthell Lonni FALCON, PA-C  sucralfate  (CARAFATE ) 1 g tablet Take 1 tablet (1 g total) by mouth 4 (four) times daily -  with meals and at bedtime. 12/18/22   Ruthell Lonni FALCON, PA-C  traZODone  (DESYREL ) 50 MG tablet Take 1 tablet (50 mg total) by mouth at bedtime. 09/09/23   Nwoko, Uchenna E, PA  VIENVA 0.1-20 MG-MCG tablet Take 1 tablet by mouth daily. 08/22/21   [provider]  omeprazole  (PRILOSEC) 40  MG capsule Take 1 capsule (40 mg total) by mouth daily. 03/05/19 04/12/19  Alva Larraine FALCON, PA-C    Allergies: Apple juice, Kiwi extract, Strawberry extract, and Lamotrigine    Review of Systems  Updated Vital Signs BP 101/60 (BP Location: Right Arm)   Pulse 81   Temp 98.4 F (36.9 C) (Oral)   Resp 18   Ht 5' 3 (1.6 m)   Wt 51.7 kg   LMP 12/16/2023 (Exact Date)   SpO2 100%   BMI 20.19 kg/m   Physical Exam Constitutional:      General: She is not in acute distress. HENT:     Head: Normocephalic and atraumatic.  Eyes:     General: No visual field deficit.    Conjunctiva/sclera: Conjunctivae normal.     Pupils: Pupils are equal, round, and reactive to  light.  Cardiovascular:     Rate and Rhythm: Normal rate and regular rhythm.  Pulmonary:     Effort: Pulmonary effort is normal. No respiratory distress.  Abdominal:     General: There is no distension.     Tenderness: There is no abdominal tenderness.  Skin:    General: Skin is warm and dry.  Neurological:     General: No focal deficit present.     Mental Status: She is alert and oriented to person, place, and time. Mental status is at baseline.     GCS: GCS eye subscore is 4. GCS verbal subscore is 5. GCS motor subscore is 6.     Cranial Nerves: No cranial nerve deficit, dysarthria or facial asymmetry.  Psychiatric:        Mood and Affect: Mood normal.        Behavior: Behavior normal.     (all labs ordered are listed, but only abnormal results are displayed) Labs Reviewed  BASIC METABOLIC PANEL WITH GFR - Abnormal; Notable for the following components:      Result Value   Glucose, Bld 100 (*)    All other components within normal limits  CBC - Abnormal; Notable for the following components:   RBC 3.76 (*)    All other components within normal limits  HCG, SERUM, QUALITATIVE    EKG: EKG Interpretation Date/Time:  Monday December 16 2023 14:33:13 EDT Ventricular Rate:  71 PR Interval:  137 QRS Duration:  92 QT Interval:  374 QTC Calculation: 407 R Axis:   80  Text Interpretation: Sinus rhythm Confirmed by Cottie Cough [phone]) on 12/16/2023 3:34:44 PM  Radiology: No results found.   Procedures   Medications Ordered in the ED  sodium chloride  0.9 % bolus 1,000 mL (1,000 mLs Intravenous New Bag/Given 12/16/23 1534)  metoCLOPramide  (REGLAN ) injection 10 mg (10 mg Intravenous Given 12/16/23 1534)  ketorolac  (TORADOL ) 30 MG/ML injection 30 mg (30 mg Intravenous Given 12/16/23 1532)  diphenhydrAMINE  (BENADRYL ) injection 12.5 mg (12.5 mg Intravenous Given 12/16/23 1532)    Clinical Course as of 12/16/23 1613  Mon Dec 16, 2023  1612 Pt feeling much better with IV meds,  ambulating steadily, drinking gatorade -okay for discharge [MT]    Clinical Course User Index [MT] Alaster Asfaw, Cough PARAS, MD                                 Medical Decision Making Amount and/or Complexity of Data Reviewed Labs: ordered. ECG/medicine tests: ordered.  Risk Prescription drug management.   This patient presents to the ED with concern  for near syncope, headache. This involves an extensive number of treatment options, and is a complaint that carries with it a high risk of complications and morbidity.  The differential diagnosis includes is a vagal episode versus arrhythmia versus anemia versus migraine headache versus other  I ordered and personally interpreted labs.  The pertinent results include: No emergent findings  The patient has no red flag symptoms to raise concern at this time for subarachnoid hemorrhage, aneurysm, meningitis, or other life-threatening causes of headache, including thrombosis.  Her headache is in a typical fashion for her recurring regular migraines.  They have ordered an IV migraine cocktail.  I do not see an indication for emergent neuroimaging.  She is clinically quite comfortable appearing in the room-no nuchal rigidity, no photophobia, no confusion.  The patient was maintained on a cardiac monitor.  I personally viewed and interpreted the cardiac monitored which showed an underlying rhythm of: Normal sinus rhythm  Per my interpretation the patient's ECG shows normal sinus rhythm  I ordered medication including IV fluids and migraine medications  I have reviewed the patients home medicines and have made adjustments as needed  Test Considered: No risk factors for acute PE, PERC negative, no indication for CT angiogram imaging at this time; no indication for LP  After the interventions noted above, I reevaluated the patient and found that they have: improved   I suspect this was a vasovagal episode that occurred today.  Hospitalization was  considered but this point I do not see indication for medical admission.  Do not believe she needs emergent echocardiogram.  Doubt life-threatening condition.  Disposition:  After consideration of the diagnostic results and the patient's response to treatment, I feel that the patent would benefit from outpatient follow-up      Final diagnoses:  Near syncope  Other migraine without status migrainosus, not intractable    ED Discharge Orders     None          Joeli Fenner, Donnice PARAS, MD 12/16/23 [phone]

## 2023-12-22 ENCOUNTER — Emergency Department (HOSPITAL_BASED_OUTPATIENT_CLINIC_OR_DEPARTMENT_OTHER)
Admission: EM | Admit: 2023-12-22 | Discharge: 2023-12-22 | Disposition: A | Discharge status: Home / Self Care | Source: Ambulatory Visit

## 2023-12-22 ENCOUNTER — Emergency Department (HOSPITAL_BASED_OUTPATIENT_CLINIC_OR_DEPARTMENT_OTHER)

## 2023-12-22 ENCOUNTER — Encounter (HOSPITAL_BASED_OUTPATIENT_CLINIC_OR_DEPARTMENT_OTHER): Payer: Self-pay

## 2023-12-22 ENCOUNTER — Ambulatory Visit
Admission: EM | Admit: 2023-12-22 | Discharge: 2023-12-22 | Disposition: A | Discharge status: Other Acute Inpatient Hospital | Attending: Family Medicine | Admitting: Family Medicine

## 2023-12-22 ENCOUNTER — Other Ambulatory Visit: Payer: Self-pay

## 2023-12-22 DIAGNOSIS — R519 Headache, unspecified: Secondary | ICD-10-CM

## 2023-12-22 DIAGNOSIS — R42 Dizziness and giddiness: Secondary | ICD-10-CM | POA: Insufficient documentation

## 2023-12-22 DIAGNOSIS — E876 Hypokalemia: Secondary | ICD-10-CM | POA: Insufficient documentation

## 2023-12-22 LAB — D-DIMER, QUANTITATIVE: D-Dimer, Quant: <0.27 ug{FEU}/mL (ref 0.00–0.50)

## 2023-12-22 LAB — URINALYSIS, ROUTINE W REFLEX MICROSCOPIC
Bilirubin Urine: NEGATIVE
Glucose, UA: NEGATIVE mg/dL
Hgb urine dipstick: NEGATIVE
Ketones, ur: NEGATIVE mg/dL
Leukocytes,Ua: NEGATIVE
Nitrite: NEGATIVE
Protein, ur: NEGATIVE mg/dL
Specific Gravity, Urine: 1.025 (ref 1.005–1.030)
pH: 7 (ref 5.0–8.0)

## 2023-12-22 LAB — CBC
HCT: 35.5 % — ABNORMAL LOW (ref 36.0–46.0)
Hemoglobin: 12.2 g/dL (ref 12.0–15.0)
MCH: 32.8 pg (ref 26.0–34.0)
MCHC: 34.4 g/dL (ref 30.0–36.0)
MCV: 95.4 fL (ref 80.0–100.0)
Platelets: 198 K/uL (ref 150–400)
RBC: 3.72 MIL/uL — ABNORMAL LOW (ref 3.87–5.11)
RDW: 12.2 % (ref 11.5–15.5)
WBC: 7.1 K/uL (ref 4.0–10.5)
nRBC: 0 % (ref 0.0–0.2)

## 2023-12-22 LAB — BASIC METABOLIC PANEL WITH GFR
Anion gap: 10 (ref 5–15)
BUN: 18 mg/dL (ref 6–20)
CO2: 28 mmol/L (ref 22–32)
Calcium: 9.6 mg/dL (ref 8.9–10.3)
Chloride: 104 mmol/L (ref 98–111)
Creatinine, Ser: 0.57 mg/dL (ref 0.44–1.00)
GFR, Estimated: >60 mL/min (ref >60)
Glucose, Bld: 88 mg/dL (ref 70–99)
Potassium: 3.4 mmol/L — ABNORMAL LOW (ref 3.5–5.1)
Sodium: 141 mmol/L (ref 135–145)

## 2023-12-22 LAB — PREGNANCY, URINE: Preg Test, Ur: NEGATIVE

## 2023-12-22 LAB — MAGNESIUM: Magnesium: 2.2 mg/dL (ref 1.7–2.4)

## 2023-12-22 MED ORDER — POTASSIUM CHLORIDE CRYS ER 20 MEQ PO TBCR
40.0000 meq | EXTENDED_RELEASE_TABLET | Freq: Once | ORAL | Status: AC
  Administered 2023-12-22: 40 meq via ORAL
  Filled 2023-12-22: qty 2

## 2023-12-22 MED ORDER — ACETAMINOPHEN 500 MG PO TABS
1000.0000 mg | ORAL_TABLET | Freq: Once | ORAL | Status: AC
  Administered 2023-12-22: 1000 mg via ORAL
  Filled 2023-12-22: qty 2

## 2023-12-22 MED ORDER — LACTATED RINGERS IV BOLUS
1000.0000 mL | Freq: Once | INTRAVENOUS | Status: AC
  Administered 2023-12-22: 1000 mL via INTRAVENOUS

## 2023-12-22 NOTE — ED Provider Notes (Signed)
 [place] EMERGENCY DEPARTMENT AT [facility] Provider Note   CSN: [id number] Arrival date & time: 12/22/23  1442     Patient presents with: Migraine   Sylvia Gray is a 23 y.o. female.   This is a 23 year old female presenting emergency department for headache.  She reports that she has had migraine headaches since she was in high school.  Typically left-sided has had intermittent headache for the past week.  Somewhat decreased oral intake over the past same time.  Reports being lightheaded at work today.  And sent for further evaluation.  No fevers rhinorrhea congestion.  She did note that she had some chest pain earlier when she was lightheaded.  And continues to still feel somewhat lightheaded.  No shortness of breath.  No palpitations.  No neck stiffness, photophobia or rash.  No unilateral weakness.  No numbness tingling or changes in sensation.   Migraine       Prior to Admission medications   Medication Sig Start Date End Date Taking? Authorizing Provider  amoxicillin -clavulanate (AUGMENTIN ) 875-125 MG tablet Take 1 tablet by mouth every 12 (twelve) hours. 05/06/22   Smoot, Lauraine LABOR, PA-C  buPROPion  (WELLBUTRIN  XL) 150 MG 24 hr tablet Take 1 tablet (150 mg total) by mouth daily. 04/19/23   Nwoko, Uchenna E, PA  cariprazine  (VRAYLAR ) 1.5 MG capsule Take 1 capsule (1.5 mg total) by mouth daily. 11/08/23   Nwoko, Uchenna E, PA  clotrimazole  (GYNE-LOTRIMIN ) 1 % vaginal cream Place 1 Applicatorful vaginally at bedtime. 10/22/21   Marry Clamp, MD  divalproex  (DEPAKOTE ) 500 MG DR tablet Take 1 tablet (500 mg total) by mouth 2 (two) times daily. 10/21/23   Nwoko, Uchenna E, PA  hydrOXYzine  (ATARAX ) 10 MG tablet Take 1 tablet (10 mg total) by mouth 3 (three) times daily as needed. 09/09/23   Nwoko, Uchenna E, PA  metroNIDAZOLE  (FLAGYL ) 500 MG tablet Take 1 tablet (500 mg total) by mouth 2 (two) times daily. 10/25/21   Henderly, Britni A, PA-C  naproxen  (NAPROSYN )  500 MG tablet Take 1 tablet (500 mg total) by mouth 2 (two) times daily. 10/25/21   Henderly, Britni A, PA-C  ondansetron  (ZOFRAN ) 4 MG tablet Take 1 tablet (4 mg total) by mouth every 6 (six) hours as needed for nausea or vomiting. 12/18/22   Ruthell Lonni FALCON, PA-C  ondansetron  (ZOFRAN -ODT) 4 MG disintegrating tablet Take 1 tablet (4 mg total) by mouth every 8 (eight) hours as needed for nausea or vomiting. 05/06/22   Smoot, Lauraine LABOR, PA-C  ondansetron  (ZOFRAN -ODT) 8 MG disintegrating tablet Take 1 tablet (8 mg total) by mouth every 8 (eight) hours as needed for nausea or vomiting. 06/14/22   Macario Dorothyann CHRISTELLA, MD  pantoprazole  (PROTONIX ) 20 MG tablet Take 1 tablet (20 mg total) by mouth daily. 12/18/22   Ruthell Lonni FALCON, PA-C  sucralfate  (CARAFATE ) 1 g tablet Take 1 tablet (1 g total) by mouth 4 (four) times daily -  with meals and at bedtime. 12/18/22   Ruthell Lonni FALCON, PA-C  traZODone  (DESYREL ) 50 MG tablet Take 1 tablet (50 mg total) by mouth at bedtime. 09/09/23   Nwoko, Uchenna E, PA  VIENVA 0.1-20 MG-MCG tablet Take 1 tablet by mouth daily. 08/22/21   [provider]  omeprazole  (PRILOSEC) 40 MG capsule Take 1 capsule (40 mg total) by mouth daily. 03/05/19 04/12/19  Kehrli, Kelsey F, PA-C    Allergies: Apple juice, Kiwi extract, Strawberry extract, and Lamotrigine    Review of  Systems  Updated Vital Signs BP 102/75   Pulse 68   Temp (!) 97.5 F (36.4 C) (Oral)   Resp 14   Wt 51.3 kg   LMP 12/16/2023 (Exact Date)   SpO2 95%   BMI 20.02 kg/m   Physical Exam Vitals and nursing note reviewed.  Constitutional:      General: She is not in acute distress.    Appearance: She is not toxic-appearing.  HENT:     Head: Normocephalic and atraumatic.     Nose: Nose normal.  Eyes:     Extraocular Movements: Extraocular movements intact.     Conjunctiva/sclera: Conjunctivae normal.     Pupils: Pupils are equal, round, and reactive to light.  Cardiovascular:     Rate and  Rhythm: Normal rate and regular rhythm.  Pulmonary:     Effort: Pulmonary effort is normal.     Breath sounds: Normal breath sounds.  Abdominal:     General: Abdomen is flat.     Palpations: Abdomen is soft.  Musculoskeletal:        General: Normal range of motion.     Cervical back: Normal range of motion and neck supple.  Skin:    General: Skin is warm and dry.     Capillary Refill: Capillary refill takes less than 2 seconds.     Findings: No rash.  Neurological:     General: No focal deficit present.     Mental Status: She is alert and oriented to person, place, and time.     Cranial Nerves: No cranial nerve deficit.     Sensory: No sensory deficit.     Motor: No weakness.     Coordination: Coordination normal.  Psychiatric:        Mood and Affect: Mood normal.        Behavior: Behavior normal.     (all labs ordered are listed, but only abnormal results are displayed) Labs Reviewed  CBC - Abnormal; Notable for the following components:      Result Value   RBC 3.72 (*)    HCT 35.5 (*)    All other components within normal limits  BASIC METABOLIC PANEL WITH GFR - Abnormal; Notable for the following components:   Potassium 3.4 (*)    All other components within normal limits  PREGNANCY, URINE  MAGNESIUM   D-DIMER, QUANTITATIVE  URINALYSIS, ROUTINE W REFLEX MICROSCOPIC    EKG: None  Radiology: CT Head Wo Contrast Result Date: 12/22/2023 CLINICAL DATA:  Headache for 1 week, history of migraine headaches, lightheadedness, left ear pain EXAM: CT HEAD WITHOUT CONTRAST TECHNIQUE: Contiguous axial images were obtained from the base of the skull through the vertex without intravenous contrast. RADIATION DOSE REDUCTION: This exam was performed according to the departmental dose-optimization program which includes automated exposure control, adjustment of the mA and/or kV according to patient size and/or use of iterative reconstruction technique. COMPARISON:  None Available.  FINDINGS: Brain: No acute infarct or hemorrhage. Lateral ventricles and midline structures are unremarkable. No acute extra-axial fluid collections. No mass effect. Vascular: No hyperdense vessel or unexpected calcification. Skull: Normal. Negative for fracture or focal lesion. Sinuses/Orbits: No acute finding. Other: None. IMPRESSION: 1. No acute intracranial process. Electronically Signed   By: Ozell Daring M.D.   On: 12/22/2023 16:00   DG Chest Portable 1 View Result Date: 12/22/2023 CLINICAL DATA:  Near syncope. EXAM: PORTABLE CHEST 1 VIEW COMPARISON:  November 26, 2016. FINDINGS: The heart size and mediastinal contours are within normal  limits. Both lungs are clear. The visualized skeletal structures are unremarkable. IMPRESSION: No active disease. Electronically Signed   By: Lynwood Landy Raddle M.D.   On: 12/22/2023 15:54     Procedures   Medications Ordered in the ED  potassium chloride  SA (KLOR-CON  M) CR tablet 40 mEq (has no administration in time range)  lactated ringers  bolus 1,000 mL (1,000 mLs Intravenous New Bag/Given 12/22/23 1555)  acetaminophen  (TYLENOL ) tablet 1,000 mg (1,000 mg Oral Given 12/22/23 1531)    Clinical Course as of 12/22/23 1706  Sun Dec 22, 2023  1703 Patient reevaluated.  Reports significant improvement in headache as well as her lightheadedness.  She has no leukocytosis to suggest stomach infection no anemia on her CBC.  Basic metabolic panel with borderline low potassium at 3.4, repleted.  No other significant metabolic derangements.  Normal kidney function.  Normal magnesium .  Pregnancy test negative, ectopic pregnancy unlikely.  UA negative for urinary tract infection.  Her D-dimer was negative, otherwise low risk for PE.   I suspect some of her lightheadedness was secondary to hypovolemia as she is feeling markedly improved after IV fluids and Tylenol .  She is feeling improved and ready to go home. [TY]    Clinical Course User Index [TY] Neysa Caron PARAS, DO                                  Medical Decision Making This is a 23 year old female presenting emergency department for headache.  Per chart review has a history of bipolar anxiety depression.  Reports similar type headaches since in high school.  However, current headache is worse and has lasted somewhat longer than her usual.  Will get CT head given increased frequency and pain.  Will also get screening labs, EKG and chest x-ray to evaluate for her lightheadedness/near syncope.  Blood pressure is somewhat soft at 102/75.  Given her self-reported decreased p.o. intake suspect hypovolemia/orthostasis.  Will get orthostatic vital signs.  Will give IV fluids and tylenol  initially. Will get D-dimer to exclude PE given lightheadness as she is otherwise low risk for PE/DVT.  See ED course for further MDM disposition  Amount and/or Complexity of Data Reviewed External Data Reviewed:     Details: Was seen a week ago but at ED and discharged after negative workup Labs: ordered. Decision-making details documented in ED Course. Radiology: ordered and independent interpretation performed. Decision-making details documented in ED Course. ECG/medicine tests: ordered and independent interpretation performed. Decision-making details documented in ED Course.  Risk OTC drugs. Prescription drug management. Decision regarding hospitalization. Diagnosis or treatment significantly limited by social determinants of health. Risk Details: Poor health literacy.       Final diagnoses:  None    ED Discharge Orders     None          Neysa Caron PARAS, DO 12/22/23 1706

## 2023-12-22 NOTE — ED Triage Notes (Signed)
 Pt c/o lightheadness, near syncope, left ear painx1wk. PT states was seen for same in ED on Monday. PT states woke up diaphoretic today.

## 2023-12-22 NOTE — ED Notes (Signed)
 Patient transported to CT

## 2023-12-22 NOTE — Discharge Instructions (Signed)
 Please follow-up with your primary doctor.  We are also giving the number to a neurologist that you may see for your headaches.  Return show fevers, chills, passout, worsening chest pain, uncontrolled nausea or vomiting, unilateral weakness, seizure activity, or any new or worsening symptoms that are concerning to you.

## 2023-12-22 NOTE — ED Triage Notes (Signed)
 Complaining severe headache x 1 week. Described as worst headache she has ever had. Pt has hx of migraines Reports feeling  lightheaded. And shaky  Left. Ear pain . Near syncopal episode today

## 2023-12-22 NOTE — ED Provider Notes (Signed)
 [facility]    CSN: [id number] Arrival date & time: 12/22/23  1225      History   Chief Complaint No chief complaint on file.   HPI Sylvia Gray is a 23 y.o. female who presents for headache ear pain.  Patient reports 1 week of a intermittent left-sided headache that starts behind her eye and radiates back.  She does states she has a history of migraines and states this feels like a migraine but worse than normal.  She currently rates her headache as a 10 out of 10 and reports it is the worst headache of her life.  Denies thunderclap headache.  Does endorse dizziness/lightheadedness with near syncopal episodes and extreme weakness that is not unilateral.  Also endorses some blurry vision that is new.  No cough or congestion but does report left ear pain for the past week as well.  She was seen in the emergency room on September 1 for near syncopal episode as well.  She was diagnosed with a migraine with labs that showed no emergent findings.  She was treated for migraine and sent home.  She states migraines have persisted since then and is now the worst one of her life.  She has not taken anything for her headache today.  Denies family history of headaches and no first-degree relative with SAH.  No other concerns at this time.  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 Authorizing Provider  amoxicillin -clavulanate (AUGMENTIN ) 875-125 MG tablet Take 1 tablet by mouth every 12 (twelve) hours. 05/06/22   Smoot, Lauraine LABOR, PA-C  buPROPion  (WELLBUTRIN  XL) 150 MG 24 hr tablet Take 1 tablet (150 mg total) by mouth daily. 04/19/23   Nwoko, Uchenna E, PA  cariprazine  (VRAYLAR ) 1.5 MG capsule Take 1 capsule (1.5 mg total) by mouth daily. 11/08/23   Nwoko, Uchenna E, PA  clotrimazole  (GYNE-LOTRIMIN ) 1 % vaginal cream Place 1 Applicatorful vaginally at bedtime. 10/22/21   Marry Clamp, MD  divalproex  (DEPAKOTE ) 500 MG DR tablet Take 1 tablet (500 mg total) by mouth 2 (two) times daily. 10/21/23   Nwoko, Uchenna E, PA  hydrOXYzine  (ATARAX ) 10 MG tablet Take 1 tablet (10 mg total) by mouth 3 (three) times daily as  needed. 09/09/23   Nwoko, Uchenna E, PA  metroNIDAZOLE  (FLAGYL ) 500 MG tablet Take 1 tablet (500 mg total) by mouth 2 (two) times daily. 10/25/21   Henderly, Britni A, PA-C  naproxen  (NAPROSYN ) 500 MG tablet Take 1 tablet (500 mg total) by mouth 2 (two) times daily. 10/25/21   Henderly, Britni A, PA-C  ondansetron  (ZOFRAN ) 4 MG tablet Take 1 tablet (4 mg total) by mouth every 6 (six) hours as needed for nausea or vomiting. 12/18/22   Ruthell Lonni FALCON, PA-C  ondansetron  (ZOFRAN -ODT) 4 MG disintegrating tablet Take 1 tablet (4 mg total) by mouth every 8 (eight) hours as needed for nausea or vomiting. 05/06/22   Smoot, Lauraine LABOR, PA-C  ondansetron  (ZOFRAN -ODT) 8 MG disintegrating tablet Take 1 tablet (8 mg total) by mouth every 8 (eight) hours as needed for nausea or vomiting. 06/14/22   Macario Dorothyann HERO, MD  pantoprazole  (PROTONIX ) 20 MG tablet Take 1  tablet (20 mg total) by mouth daily. 12/18/22   Ruthell Lonni FALCON, PA-C  sucralfate  (CARAFATE ) 1 g tablet Take 1 tablet (1 g total) by mouth 4 (four) times daily -  with meals and at bedtime. 12/18/22   Ruthell Lonni FALCON, PA-C  traZODone  (DESYREL ) 50 MG tablet Take 1 tablet (50 mg total) by mouth at bedtime. 09/09/23   Nwoko, Uchenna E, PA  VIENVA 0.1-20 MG-MCG tablet Take 1 tablet by mouth daily. 08/22/21   [provider]  omeprazole  (PRILOSEC) 40 MG capsule Take 1 capsule (40 mg total) by mouth daily. 03/05/19 04/12/19  Alva Larraine FALCON, PA-C    Family History History reviewed. No pertinent family history.  Social History Social History   Tobacco Use   Smoking status: Never   Smokeless tobacco: Never  Vaping Use   Vaping status: Every Day  Substance Use Topics   Alcohol use: Yes    Comment: occ   Drug use: Never     Allergies   Apple juice, Kiwi extract, Strawberry extract, and Lamotrigine   Review of Systems Review of Systems  HENT:  Positive for ear pain.   Neurological:  Positive for dizziness, weakness, light-headedness and headaches.     Physical Exam Triage Vital Signs ED Triage Vitals  Encounter Vitals Group     BP 12/22/23 1349 98/66     Girls Systolic BP Percentile --      Girls Diastolic BP Percentile --      Boys Systolic BP Percentile --      Boys Diastolic BP Percentile --      Pulse Rate 12/22/23 1349 65     Resp 12/22/23 1349 16     Temp 12/22/23 1349 98.3 F (36.8 C)     Temp Source 12/22/23 1349 Oral     SpO2 12/22/23 1349 95 %     Weight --      Height --      Head Circumference --      Peak Flow --      Pain Score 12/22/23 1347 10     Pain Loc --      Pain Education --      Exclude from Growth Chart --    No data found.  Updated Vital Signs BP 98/66   Pulse 65   Temp 98.3 F (36.8 C) (Oral)   Resp 16   LMP 12/16/2023 (Exact Date)   SpO2 95%   Visual Acuity Right Eye Distance:   Left Eye Distance:   Bilateral  Distance:  Right Eye Near:   Left Eye Near:    Bilateral Near:     Physical Exam Vitals and nursing note reviewed.  Constitutional:      General: She is not in acute distress.    Appearance: Normal appearance. She is not ill-appearing.  HENT:     Head: Normocephalic and atraumatic.     Right Ear: Tympanic membrane and ear canal normal.     Left Ear: Tympanic membrane and ear canal normal.  Eyes:     Extraocular Movements: Extraocular movements intact.     Conjunctiva/sclera: Conjunctivae normal.     Pupils: Pupils are equal, round, and reactive to light.  Cardiovascular:     Rate and Rhythm: Normal rate.  Pulmonary:     Effort: Pulmonary effort is normal.  Skin:    General: Skin is warm and dry.  Neurological:     General: No focal deficit present.     Mental Status: She is alert and oriented to person, place, and time.     GCS: GCS eye subscore is 4. GCS verbal subscore is 5. GCS motor subscore is 6.     Cranial Nerves: No facial asymmetry.     Motor: No weakness or pronator drift.     Coordination: Romberg sign negative. Finger-Nose-Finger Test normal.     Gait: Tandem walk abnormal.     Comments: Patient struggled with tandem walk  Psychiatric:        Mood and Affect: Mood normal.        Behavior: Behavior normal.      UC Treatments / Results  Labs (all labs ordered are listed, but only abnormal results are displayed) Labs Reviewed - No data to display  EKG   Radiology No results found.  Procedures Procedures (including critical care time)  Medications Ordered in UC Medications - No data to display  Initial Impression / Assessment and Plan / UC Course  I have reviewed the triage vital signs and the nursing notes.  Pertinent labs & imaging results that were available during my care of the patient were reviewed by me and considered in my medical decision making (see chart for details).     I reviewed exam and symptoms with patient.  Discussed  limitations and abilities of urgent care.  Patient presenting with worst headache of her life.  Given this I advise she go to the emergency room ASAP for further treatment.  She is in agreement with plan and will go POV with family driving to the ER. Final Clinical Impressions(s) / UC Diagnoses   Final diagnoses:  Worst headache of life     Discharge Instructions      Please go to emergency room for further evaluation of your worst headache of life    ED Prescriptions   None    PDMP not reviewed this encounter.   Loreda Myla SAUNDERS, NP 12/22/23 [phone]

## 2023-12-22 NOTE — Discharge Instructions (Signed)
 Please go to emergency room for further evaluation of your worst headache of life

## 2023-12-22 NOTE — ED Notes (Signed)
 Patient is being discharged from the Urgent Care and sent to the Emergency Department via POV . Per Myla Bold, NP, patient is in need of higher level of care due to needing further tests. Patient is aware and verbalizes understanding of plan of care.  Vitals:   12/22/23 1349  BP: 98/66  Pulse: 65  Resp: 16  Temp: 98.3 F (36.8 C)  SpO2: 95%

## 2024-01-29 ENCOUNTER — Other Ambulatory Visit: Payer: Self-pay | Admitting: Medical Genetics

## 2024-01-29 DIAGNOSIS — Z006 Encounter for examination for normal comparison and control in clinical research program: Secondary | ICD-10-CM

## 2024-05-07 ENCOUNTER — Ambulatory Visit: Payer: Self-pay

## 2024-05-15 ENCOUNTER — Ambulatory Visit
Admission: RE | Admit: 2024-05-15 | Discharge: 2024-05-15 | Disposition: A | Discharge status: Home / Self Care | Payer: Self-pay | Source: Ambulatory Visit | Attending: Family Medicine

## 2024-05-15 VITALS — BP 91/60 | HR 74 | Temp 99.2°F | Resp 16

## 2024-05-15 DIAGNOSIS — R82998 Other abnormal findings in urine: Secondary | ICD-10-CM | POA: Insufficient documentation

## 2024-05-15 DIAGNOSIS — Z113 Encounter for screening for infections with a predominantly sexual mode of transmission: Secondary | ICD-10-CM | POA: Diagnosis present

## 2024-05-15 DIAGNOSIS — N898 Other specified noninflammatory disorders of vagina: Secondary | ICD-10-CM | POA: Diagnosis present

## 2024-05-15 LAB — POCT URINE DIPSTICK
Bilirubin, UA: NEGATIVE
Glucose, UA: NEGATIVE mg/dL
Ketones, POC UA: NEGATIVE mg/dL
Nitrite, UA: NEGATIVE
POC PROTEIN,UA: NEGATIVE
Spec Grav, UA: 1.025
Urobilinogen, UA: 0.2 U/dL
pH, UA: 6.5

## 2024-05-15 LAB — CERVICOVAGINAL ANCILLARY ONLY
Bacterial Vaginitis (gardnerella): POSITIVE — AB
Candida Glabrata: NEGATIVE
Candida Vaginitis: NEGATIVE
Chlamydia: NEGATIVE
Comment: NEGATIVE
Comment: NEGATIVE
Comment: NEGATIVE
Comment: NEGATIVE
Comment: NEGATIVE
Comment: NORMAL
Neisseria Gonorrhea: NEGATIVE
Trichomonas: NEGATIVE

## 2024-05-15 LAB — POCT URINE PREGNANCY: Preg Test, Ur: NEGATIVE

## 2024-05-15 MED ORDER — FLUCONAZOLE 150 MG PO TABS: 150.0000 mg | ORAL_TABLET | Freq: Every day | ORAL | 0 refills | Status: AC

## 2024-05-15 NOTE — ED Triage Notes (Signed)
 Patient presents for vaginal itching, and discharge.  Patient denies odor.  Patient has not taken any medication for it.,

## 2024-05-15 NOTE — Discharge Instructions (Addendum)
 The clinic will contact you with results of the vaginal swab, urine culture, and STD testing done today if positive.  Start Diflucan  twice daily for your yeast infection symptoms.  Follow-up with your PCP or gynecologist if your symptoms do not improve.  Please go to the ER for any worsening symptoms.  Hope you feel better soon!

## 2024-05-15 NOTE — ED Provider Notes (Signed)
 [name]    CSN: [id number] Arrival date & time: 05/15/24  1152      History   Chief Complaint Chief Complaint  Patient presents with   Vaginal Itching    Yeast infection symptoms - Entered by patient    HPI Sylvia Gray is a 24 y.o. female who presents 1 week for vaginal itching/discharge.  Denies any malodorous discharge, dysuria, fevers, vomiting or flank pain.  No STD exposure but would like screening.  Has not taken any OTC treatments for symptoms.  No other concerns.   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 Authorizing Provider  fluconazole  (DIFLUCAN ) 150 MG tablet Take 1 tablet (150 mg total) by mouth daily for 2 doses. Take 1 tablet today and you may repeat in 3 days if symptoms persist 05/15/24 05/17/24 Yes Haydn Hutsell, Jodi R, NP  amoxicillin -clavulanate (AUGMENTIN ) 875-125 MG tablet Take 1 tablet by mouth every 12 (twelve) hours. 05/06/22   Smoot, Lauraine LABOR, PA-C  buPROPion  (WELLBUTRIN  XL) 150 MG 24 hr  tablet Take 1 tablet (150 mg total) by mouth daily. 04/19/23   Nwoko, Uchenna E, PA  cariprazine  (VRAYLAR ) 1.5 MG capsule Take 1 capsule (1.5 mg total) by mouth daily. 11/08/23   Nwoko, Uchenna E, PA  clotrimazole  (GYNE-LOTRIMIN ) 1 % vaginal cream Place 1 Applicatorful vaginally at bedtime. 10/22/21   Marry Clamp, MD  divalproex  (DEPAKOTE ) 500 MG DR tablet Take 1 tablet (500 mg total) by mouth 2 (two) times daily. 10/21/23   Nwoko, Uchenna E, PA  hydrOXYzine  (ATARAX ) 10 MG tablet Take 1 tablet (10 mg total) by mouth 3 (three) times daily as needed. 09/09/23   Nwoko, Uchenna E, PA  metroNIDAZOLE  (FLAGYL ) 500 MG tablet Take 1 tablet (500 mg total) by mouth 2 (two) times daily. 10/25/21   Henderly, Britni A, PA-C  naproxen  (NAPROSYN ) 500 MG tablet Take 1 tablet (500 mg total) by mouth 2 (two) times daily. 10/25/21   Henderly, Britni A, PA-C  ondansetron  (ZOFRAN ) 4 MG tablet Take 1 tablet (4 mg total) by mouth every 6 (six) hours as needed for nausea or vomiting. 12/18/22   Ruthell Lonni FALCON, PA-C  ondansetron  (ZOFRAN -ODT) 4 MG disintegrating tablet Take 1 tablet (4  mg total) by mouth every 8 (eight) hours as needed for nausea or vomiting. 05/06/22   Smoot, Lauraine LABOR, PA-C  ondansetron  (ZOFRAN -ODT) 8 MG disintegrating tablet Take 1 tablet (8 mg total) by mouth every 8 (eight) hours as needed for nausea or vomiting. 06/14/22   Macario Dorothyann HERO, MD  pantoprazole  (PROTONIX ) 20 MG tablet Take 1 tablet (20 mg total) by mouth daily. 12/18/22   Ruthell Lonni FALCON, PA-C  sucralfate  (CARAFATE ) 1 g tablet Take 1 tablet (1 g total) by mouth 4 (four) times daily -  with meals and at bedtime. 12/18/22   Ruthell Lonni FALCON, PA-C  traZODone  (DESYREL ) 50 MG tablet Take 1 tablet (50 mg total) by mouth at bedtime. 09/09/23   Nwoko, Uchenna E, PA  VIENVA 0.1-20 MG-MCG tablet Take 1 tablet by mouth daily. 08/22/21   [provider]  omeprazole  (PRILOSEC) 40 MG capsule Take 1 capsule (40 mg total) by mouth daily. 03/05/19  04/12/19  Alva Larraine FALCON, PA-C    Family History History reviewed. No pertinent family history.  Social History Social History[1]   Allergies   Apple juice, Kiwi extract, Strawberry extract, and Lamotrigine   Review of Systems Review of Systems  Genitourinary:  Positive for vaginal discharge.     Physical Exam Triage Vital Signs ED Triage Vitals  Encounter Vitals Group     BP 05/15/24 1216 91/60     Girls Systolic BP Percentile --      Girls Diastolic BP Percentile --      Boys Systolic BP Percentile --      Boys Diastolic BP Percentile --      Pulse Rate 05/15/24 1216 74     Resp 05/15/24 1216 16     Temp 05/15/24 1216 99.2 F (37.3 C)     Temp Source 05/15/24 1216 Temporal     SpO2 05/15/24 1216 100 %     Weight --      Height --      Head Circumference --      Peak Flow --      Pain Score 05/15/24 1215 0     Pain Loc --      Pain Education --      Exclude from Growth Chart --    No data found.  Updated Vital Signs BP 91/60 (BP Location: Right Arm)   Pulse 74   Temp 99.2 F (37.3 C) (Temporal)   Resp 16   LMP 04/27/2024 (Exact Date)   SpO2 100%   Visual Acuity Right Eye Distance:   Left Eye Distance:   Bilateral Distance:    Right Eye Near:   Left Eye Near:    Bilateral Near:     Physical Exam Vitals and nursing note reviewed.  Constitutional:      Appearance: Normal appearance.  HENT:     Head: Normocephalic and atraumatic.  Eyes:     Pupils: Pupils are equal, round, and reactive to light.  Cardiovascular:     Rate and Rhythm: Normal rate.  Pulmonary:     Effort: Pulmonary effort is normal.  Skin:    General: Skin is warm and dry.  Neurological:     General: No focal deficit present.     Mental Status: She is alert and oriented to person, place, and time.  Psychiatric:        Mood and Affect: Mood normal.        Behavior: Behavior normal.      UC Treatments /  Results  Labs (all labs ordered are listed, but only abnormal  results are displayed) Labs Reviewed  POCT URINE DIPSTICK - Abnormal; Notable for the following components:      Result Value   Color, UA light yellow (*)    Clarity, UA cloudy (*)    Blood, UA moderate (*)    Leukocytes, UA Trace (*)    All other components within normal limits  POCT URINE PREGNANCY - Normal  URINE CULTURE  SYPHILIS: RPR W/REFLEX TO RPR TITER AND TREPONEMAL ANTIBODIES, TRADITIONAL SCREENING AND DIAGNOSIS ALGORITHM  HIV ANTIBODY (ROUTINE TESTING W REFLEX)  CERVICOVAGINAL ANCILLARY ONLY    EKG   Radiology No results found.  Procedures Procedures (including critical care time)  Medications Ordered in UC Medications - No data to display  Initial Impression / Assessment and Plan / UC Course  I have reviewed the triage vital signs and the nursing notes.  Pertinent labs & imaging results that were available during my care of the patient were reviewed by me and considered in my medical decision making (see chart for details).     Reviewed exam and symptoms with patient.  UA shows trace leuks and moderate blood, she is asymptomatic.  Will send urine culture and contact if positive.  Negative urine hCG.  Will start Diflucan  for yeast infection symptoms.  Encourage PCP or GYN follow-up if symptoms do not improve.  ER precautions reviewed. Final Clinical Impressions(s) / UC Diagnoses   Final diagnoses:  Vaginal discharge  Leukocytes in urine  Screening examination for STD (sexually transmitted disease)     Discharge Instructions      The clinic will contact you with results of the vaginal swab, urine culture, and STD testing done today if positive.  Start Diflucan  twice daily for your yeast infection symptoms.  Follow-up with your PCP or gynecologist if your symptoms do not improve.  Please go to the ER for any worsening symptoms.  Hope you feel better soon!    ED Prescriptions     Medication Sig Dispense Auth. Provider   fluconazole  (DIFLUCAN ) 150 MG  tablet Take 1 tablet (150 mg total) by mouth daily for 2 doses. Take 1 tablet today and you may repeat in 3 days if symptoms persist 2 tablet Rahima Fleishman, Jodi R, NP      PDMP not reviewed this encounter.    [1]  Social History Tobacco Use   Smoking status: Never   Smokeless tobacco: Never  Vaping Use   Vaping status: Every Day  Substance Use Topics   Alcohol use: Yes    Comment: occ   Drug use: Never     Loreda Myla SAUNDERS, NP 05/15/24 1232  "

## 2024-05-16 ENCOUNTER — Ambulatory Visit (HOSPITAL_COMMUNITY): Payer: Self-pay

## 2024-05-16 LAB — URINE CULTURE: Culture: <10000 — AB

## 2024-05-16 LAB — HIV ANTIBODY (ROUTINE TESTING W REFLEX): HIV Screen 4th Generation wRfx: NONREACTIVE

## 2024-05-16 LAB — SYPHILIS: RPR W/REFLEX TO RPR TITER AND TREPONEMAL ANTIBODIES, TRADITIONAL SCREENING AND DIAGNOSIS ALGORITHM: RPR Ser Ql: NONREACTIVE

## 2024-05-16 MED ORDER — METRONIDAZOLE 0.75 % VA GEL: 1.0000 | Freq: Every day | VAGINAL | 0 refills | Status: AC

## 2024-05-16 MED ORDER — METRONIDAZOLE 500 MG PO TABS: 500.0000 mg | ORAL_TABLET | Freq: Two times a day (BID) | ORAL | 0 refills | Status: AC
